# Patient Record
Sex: Female | Born: 1960 | Race: White | Hispanic: No | Marital: Married | Smoking: Never smoker
Health system: Southern US, Community
[De-identification: ages and names within clinical notes are randomized; demographics above are authoritative.]

## PROBLEM LIST (undated history)

## (undated) DIAGNOSIS — I773 Arterial fibromuscular dysplasia: Secondary | ICD-10-CM

## (undated) DIAGNOSIS — J189 Pneumonia, unspecified organism: Secondary | ICD-10-CM

## (undated) DIAGNOSIS — E785 Hyperlipidemia, unspecified: Secondary | ICD-10-CM

## (undated) DIAGNOSIS — M1990 Unspecified osteoarthritis, unspecified site: Secondary | ICD-10-CM

## (undated) DIAGNOSIS — H5703 Miosis: Secondary | ICD-10-CM

## (undated) HISTORY — PX: BACK SURGERY: SHX140

## (undated) HISTORY — DX: Hyperlipidemia, unspecified: E78.5

## (undated) HISTORY — PX: COLONOSCOPY: SHX174

---

## 1998-03-03 ENCOUNTER — Other Ambulatory Visit: Admission: RE | Admit: 1998-03-03 | Discharge: 1998-03-03 | Payer: Self-pay | Admitting: Obstetrics and Gynecology

## 1999-08-16 ENCOUNTER — Other Ambulatory Visit: Admission: RE | Admit: 1999-08-16 | Discharge: 1999-08-16 | Payer: Self-pay | Admitting: *Deleted

## 2000-10-18 ENCOUNTER — Other Ambulatory Visit: Admission: RE | Admit: 2000-10-18 | Discharge: 2000-10-18 | Payer: Self-pay | Admitting: *Deleted

## 2000-10-23 ENCOUNTER — Encounter: Admission: RE | Admit: 2000-10-23 | Discharge: 2000-10-23 | Payer: Self-pay | Admitting: Obstetrics and Gynecology

## 2000-10-23 ENCOUNTER — Encounter: Payer: Self-pay | Admitting: Obstetrics and Gynecology

## 2001-06-01 ENCOUNTER — Ambulatory Visit (HOSPITAL_BASED_OUTPATIENT_CLINIC_OR_DEPARTMENT_OTHER): Admission: RE | Admit: 2001-06-01 | Discharge: 2001-06-01 | Payer: Self-pay | Admitting: *Deleted

## 2002-08-29 ENCOUNTER — Encounter: Payer: Self-pay | Admitting: Family Medicine

## 2002-08-29 ENCOUNTER — Encounter: Admission: RE | Admit: 2002-08-29 | Discharge: 2002-08-29 | Payer: Self-pay | Admitting: Family Medicine

## 2003-01-20 ENCOUNTER — Encounter: Admission: RE | Admit: 2003-01-20 | Discharge: 2003-01-20 | Payer: Self-pay | Admitting: Family Medicine

## 2003-01-20 ENCOUNTER — Encounter: Payer: Self-pay | Admitting: Family Medicine

## 2003-02-20 ENCOUNTER — Encounter: Admission: RE | Admit: 2003-02-20 | Discharge: 2003-02-20 | Payer: Self-pay | Admitting: Orthopedic Surgery

## 2003-02-20 ENCOUNTER — Encounter: Payer: Self-pay | Admitting: Orthopedic Surgery

## 2003-03-04 ENCOUNTER — Encounter: Admission: RE | Admit: 2003-03-04 | Discharge: 2003-03-04 | Payer: Self-pay | Admitting: Orthopedic Surgery

## 2003-03-04 ENCOUNTER — Encounter: Payer: Self-pay | Admitting: Orthopedic Surgery

## 2003-06-18 ENCOUNTER — Ambulatory Visit (HOSPITAL_COMMUNITY): Admission: RE | Admit: 2003-06-18 | Discharge: 2003-06-19 | Payer: Self-pay | Admitting: Neurosurgery

## 2005-09-08 ENCOUNTER — Ambulatory Visit (HOSPITAL_COMMUNITY): Admission: RE | Admit: 2005-09-08 | Discharge: 2005-09-08 | Payer: Self-pay | Admitting: Orthopedic Surgery

## 2005-09-27 ENCOUNTER — Encounter: Admission: RE | Admit: 2005-09-27 | Discharge: 2005-09-27 | Payer: Self-pay | Admitting: Orthopedic Surgery

## 2006-02-16 ENCOUNTER — Ambulatory Visit (HOSPITAL_COMMUNITY): Admission: RE | Admit: 2006-02-16 | Discharge: 2006-02-16 | Payer: Self-pay | Admitting: Neurosurgery

## 2010-08-14 ENCOUNTER — Encounter: Payer: Self-pay | Admitting: Orthopedic Surgery

## 2010-11-05 ENCOUNTER — Ambulatory Visit (AMBULATORY_SURGERY_CENTER): Payer: BC Managed Care – PPO | Admitting: *Deleted

## 2010-11-05 VITALS — Ht 67.0 in | Wt 182.7 lb

## 2010-11-05 DIAGNOSIS — Z1211 Encounter for screening for malignant neoplasm of colon: Secondary | ICD-10-CM

## 2010-11-05 MED ORDER — PEG-KCL-NACL-NASULF-NA ASC-C 100 G PO SOLR: ORAL | Status: DC

## 2011-01-05 ENCOUNTER — Telehealth: Payer: Self-pay | Admitting: *Deleted

## 2011-01-05 NOTE — Telephone Encounter (Signed)
Patient called and cancelled colonoscopy for 01/07/11. Her husband recently found a lump on his neck and has been having CT scans, labs, etc and is actually now scheduled to have the lump excised on 01/07/11. She is unable to reschedule at this time until they get biopsy results back but she states she will reschedule as soon as she finds out the results of the biopsy. She did ask about charge for cancellation fee. I advised her that there would be no fee as she has a legitimate reason for cancellation (and she did cancel 2 days in advance). Patient verbalizes understanding.

## 2011-01-07 ENCOUNTER — Other Ambulatory Visit: Payer: Self-pay | Admitting: Internal Medicine

## 2011-02-23 ENCOUNTER — Ambulatory Visit (AMBULATORY_SURGERY_CENTER): Payer: BC Managed Care – PPO | Admitting: *Deleted

## 2011-02-23 VITALS — Ht 67.5 in | Wt 192.3 lb

## 2011-02-23 DIAGNOSIS — Z1211 Encounter for screening for malignant neoplasm of colon: Secondary | ICD-10-CM

## 2011-03-11 ENCOUNTER — Encounter: Payer: Self-pay | Admitting: Internal Medicine

## 2011-03-11 ENCOUNTER — Ambulatory Visit (AMBULATORY_SURGERY_CENTER): Payer: BC Managed Care – PPO | Admitting: Internal Medicine

## 2011-03-11 VITALS — BP 147/88 | HR 69 | Temp 97.5°F | Resp 16 | Ht 67.0 in | Wt 192.0 lb

## 2011-03-11 DIAGNOSIS — Z1211 Encounter for screening for malignant neoplasm of colon: Secondary | ICD-10-CM

## 2011-03-11 MED ORDER — SODIUM CHLORIDE 0.9 % IV SOLN: 500.0000 mL | INTRAVENOUS | Status: DC

## 2011-03-11 NOTE — Progress Notes (Signed)
PT TURNED TO BACK AND RIGHT SIDE IN ATTEMPT TO REACH CECUM.  PT GIVEN ADDITIONAL SEDATION TO KEEP HER COMFORTABLE. PRESSURE APPLIED TO ABDOMEN.

## 2011-03-11 NOTE — Patient Instructions (Addendum)
Please refer to your blue and neon green sheets for instructions regarding diet and activity for the rest of today.  You may resume your medications as you would normally take them.  Please read information about diverticulosis and high fiber diets

## 2011-03-11 NOTE — Progress Notes (Signed)
UNABLE TO REACH CECUM WITH 1ST SCOPE.  SWITCHED OUT TO DIFFERENT SCOPE 15 MINUTES INTO PROCEDURE .

## 2011-03-14 ENCOUNTER — Telehealth: Payer: Self-pay | Admitting: *Deleted

## 2011-03-14 NOTE — Telephone Encounter (Signed)
No answer, no identifier on answering machine, no message left for patient.

## 2011-11-15 ENCOUNTER — Other Ambulatory Visit: Payer: Self-pay | Admitting: Internal Medicine

## 2011-11-15 DIAGNOSIS — H02409 Unspecified ptosis of unspecified eyelid: Secondary | ICD-10-CM

## 2011-11-18 ENCOUNTER — Ambulatory Visit
Admission: RE | Admit: 2011-11-18 | Discharge: 2011-11-18 | Disposition: A | Discharge status: Home / Self Care | Payer: BC Managed Care – PPO | Source: Ambulatory Visit | Attending: Internal Medicine | Admitting: Internal Medicine

## 2011-11-18 ENCOUNTER — Other Ambulatory Visit: Payer: Self-pay | Admitting: Internal Medicine

## 2011-11-18 DIAGNOSIS — H02409 Unspecified ptosis of unspecified eyelid: Secondary | ICD-10-CM

## 2011-11-18 DIAGNOSIS — H539 Unspecified visual disturbance: Secondary | ICD-10-CM

## 2011-11-18 MED ORDER — GADOBENATE DIMEGLUMINE 529 MG/ML IV SOLN
17.0000 mL | Freq: Once | INTRAVENOUS | Status: AC | PRN
  Administered 2011-11-18: 17 mL via INTRAVENOUS

## 2011-12-08 ENCOUNTER — Other Ambulatory Visit: Payer: Self-pay | Admitting: Diagnostic Neuroimaging

## 2011-12-08 DIAGNOSIS — R9409 Abnormal results of other function studies of central nervous system: Secondary | ICD-10-CM

## 2011-12-08 DIAGNOSIS — I7771 Dissection of carotid artery: Secondary | ICD-10-CM

## 2011-12-08 DIAGNOSIS — G909 Disorder of the autonomic nervous system, unspecified: Secondary | ICD-10-CM

## 2011-12-13 ENCOUNTER — Ambulatory Visit
Admission: RE | Admit: 2011-12-13 | Discharge: 2011-12-13 | Disposition: A | Discharge status: Home / Self Care | Payer: BC Managed Care – PPO | Source: Ambulatory Visit | Attending: Diagnostic Neuroimaging | Admitting: Diagnostic Neuroimaging

## 2011-12-13 DIAGNOSIS — I7771 Dissection of carotid artery: Secondary | ICD-10-CM

## 2011-12-13 DIAGNOSIS — G909 Disorder of the autonomic nervous system, unspecified: Secondary | ICD-10-CM

## 2011-12-13 DIAGNOSIS — R9409 Abnormal results of other function studies of central nervous system: Secondary | ICD-10-CM

## 2011-12-13 MED ORDER — IOHEXOL 350 MG/ML SOLN
100.0000 mL | Freq: Once | INTRAVENOUS | Status: AC | PRN
  Administered 2011-12-13: 100 mL via INTRAVENOUS

## 2012-06-11 ENCOUNTER — Other Ambulatory Visit: Payer: Self-pay | Admitting: Diagnostic Neuroimaging

## 2012-06-11 DIAGNOSIS — G909 Disorder of the autonomic nervous system, unspecified: Secondary | ICD-10-CM

## 2012-06-11 DIAGNOSIS — I7771 Dissection of carotid artery: Secondary | ICD-10-CM

## 2012-06-20 ENCOUNTER — Ambulatory Visit
Admission: RE | Admit: 2012-06-20 | Discharge: 2012-06-20 | Disposition: A | Discharge status: Home / Self Care | Payer: BC Managed Care – PPO | Source: Ambulatory Visit | Attending: Diagnostic Neuroimaging | Admitting: Diagnostic Neuroimaging

## 2012-06-20 ENCOUNTER — Inpatient Hospital Stay: Admission: RE | Admit: 2012-06-20 | Payer: BC Managed Care – PPO | Source: Ambulatory Visit

## 2012-06-20 DIAGNOSIS — G909 Disorder of the autonomic nervous system, unspecified: Secondary | ICD-10-CM

## 2012-06-20 DIAGNOSIS — I7771 Dissection of carotid artery: Secondary | ICD-10-CM

## 2012-06-20 MED ORDER — IOHEXOL 350 MG/ML SOLN
100.0000 mL | Freq: Once | INTRAVENOUS | Status: AC | PRN
  Administered 2012-06-20: 100 mL via INTRAVENOUS

## 2013-05-01 IMAGING — CT CT ANGIO HEAD
1 of 10 series · 2 of 33 positions shown · IV contrast ([ID] OMNI 350)
Comparison: CTA 12/13/2011.  MRA/MRI 11/18/2011.

CTA NECK

CLINICAL DATA: 51-year-old female with right side Licina
syndrome.  Bilateral carotid disease.  Fibromuscular dysplasia.
Blurred vision and neck pain.

CT ANGIOGRAPHY HEAD AND NECK
TECHNIQUE: Multidetector CT imaging of the head and neck was
performed using the standard protocol during bolus administration
of intravenous contrast.  Multiplanar CT image reconstructions
including MIPs were obtained to evaluate the vascular anatomy.
Carotid stenosis measurements (when applicable) are obtained
utilizing NASCET criteria, using the distal internal carotid
diameter as the denominator.
Contrast: 100mL OMNIPAQUE IOHEXOL 350 MG/ML SOLN

[Series 7: cta head & neck · axial · 0.37mm/px · z∈[-217,+121]mm · 2 of 136 slices shown]
[im 1/136  soft-tissue]
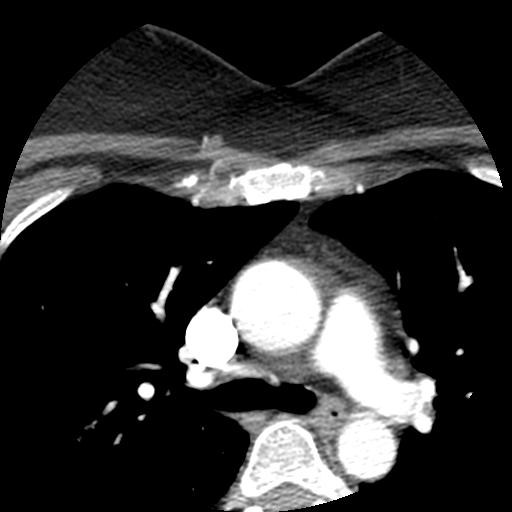
[im 136/136  bone]
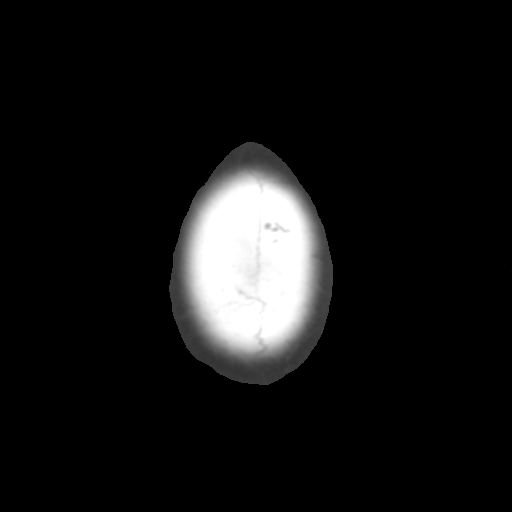

[2 of 33 positions shown; findings below may reference images not displayed]

FINDINGS: Negative lung apices.  No superior mediastinal
lymphadenopathy.  Stable and negative thyroid, larynx, pharynx,
parapharyngeal spaces, retropharyngeal space (retropharyngeal
course of the carotid arteries), sublingual space, submandibular
glands, parotid glands, and orbit soft tissues. Visualized
paranasal sinuses and mastoids are clear.  No acute osseous
abnormality identified.

Vascular Findings: Three-vessel arch configuration with no arch
atherosclerosis.  Normal great vessel origins.  Stable and negative
cervical vertebral arteries, fairly codominant.  Stable and
negative bilateral common carotid arteries aside from mild
tortuosity.

The right carotid bifurcation occurs in the retropharyngeal space.
No atherosclerosis.  Normal proximal cervical right ICA.

Stable and normal left carotid bifurcation with no atherosclerosis.
Normal proximal cervical left ICA except for tortuosity.

Interval regression of the laterally projecting pseudoaneurysm of
the distal cervical right ICA near the mastoid tip (coronal image
101). Small residual, left saccular.  No stenosis of the vessel.
Less irregularity of the adjacent segment of the right ICA.

Mild regression of irregularity and fusiform enlargement of the
distal cervical left ICA also just below the skull base (sagittal
image 118).  No associated stenosis of the vessel..

 Review of the MIP images confirms the above findings.
IMPRESSION: 1. Right better than left regression of distal cervical ICA
pseudoaneurysms. Left vessel irregularity of the adjacent ICA
segments.  No associated ICA stenosis.
2.  Stable and otherwise negative.
3.  Intracranial findings are below.

CTA HEAD
FINDINGS: Unchanged non-enhancing globular soft tissue focus
anterior to the distal basilar artery in the suprasellar cistern
(series 32 image 8, series 39 image 7.  No other intracranial mass
lesion.  No intracranial mass effect.  No ventriculomegaly.  Stable
and normal gray-white matter differentiation. No acute intracranial
hemorrhage identified.  Calvarium intact. No abnormal enhancement
identified.

Vascular Findings: Major intracranial venous structures are
enhancing.

Stable normal distal cervical ICA.  Patent PICA vessels.  Mildly
tortuous but otherwise negative vertebrobasilar junction.  No
basilar artery stenosis.  No mass effect on the basilar artery
remain dilated to the unusual soft tissue in the suprasellar
cistern.  SCA and PCA origins are normal.  Small posterior
communicating arteries are patent.  Bilateral PCA branches are
within normal limits.

Both ICA siphons remain patent and appear normal beyond the level
of the foraminal with barium.  Normal carotid termini.  Normal MCA
and ACA origins.  Normal anterior communicating artery.  ACA
branches are stable and within normal limits.  MCA branches are
stable and within normal limits.

 Review of the MIP images confirms the above findings.
IMPRESSION: 1.  Stable and negative intracranial CTA.
2.  Unchanged non-enhancing soft tissue lesion in the suprasellar
cistern anterior to the basilar artery.  No mass effect on the
basilar.  See MRI report 11/18/2011.

## 2013-07-10 ENCOUNTER — Encounter: Payer: Self-pay | Admitting: Internal Medicine

## 2013-07-10 ENCOUNTER — Ambulatory Visit (INDEPENDENT_AMBULATORY_CARE_PROVIDER_SITE_OTHER): Payer: BC Managed Care – PPO | Admitting: Internal Medicine

## 2013-07-10 VITALS — BP 138/91 | HR 77 | Temp 98.0°F | Ht 67.0 in | Wt 196.0 lb

## 2013-07-10 DIAGNOSIS — Z22322 Carrier or suspected carrier of Methicillin resistant Staphylococcus aureus: Secondary | ICD-10-CM

## 2013-07-10 MED ORDER — CHLORHEXIDINE GLUCONATE 4 % EX LIQD: Freq: Every day | CUTANEOUS | Status: DC | PRN

## 2013-07-10 MED ORDER — MUPIROCIN 2 % EX OINT: 1.0000 "application " | TOPICAL_OINTMENT | Freq: Two times a day (BID) | CUTANEOUS | Status: DC

## 2013-07-11 ENCOUNTER — Encounter: Payer: Self-pay | Admitting: Internal Medicine

## 2013-07-11 DIAGNOSIS — Z22322 Carrier or suspected carrier of Methicillin resistant Staphylococcus aureus: Secondary | ICD-10-CM | POA: Insufficient documentation

## 2013-07-11 NOTE — Progress Notes (Signed)
   Subjective:    Patient ID: Sierra Boone, female    DOB: 1961/05/19, 52 y.o.   MRN: [id number]  HPI Comes in for evaluation as a new patient.  Has had vulvar abscess x 4 over the last several months.  No current infection.  Grew MRSA positive in one culture.  Had I and D once, otherwise it opens on its own.  No fever or chills associated with it.    Review of Systems  Constitutional: Negative for fever and chills.  Gastrointestinal: Negative for nausea and diarrhea.  Genitourinary:       No current vulvar swelling  Neurological: Negative for dizziness.  Psychiatric/Behavioral: Negative for dysphoric mood.       Objective:   Physical Exam  Constitutional: She is oriented to person, place, and time. She appears well-developed and well-nourished. No distress.  Eyes: No scleral icterus.  Neurological: She is alert and oriented to person, place, and time.  Skin: No rash noted.  Psychiatric: She has a normal mood and affect. Her behavior is normal.          Assessment & Plan:

## 2013-07-11 NOTE — Assessment & Plan Note (Signed)
Will try colonization protocol. Instructions given.  Return PRN.

## 2020-05-04 ENCOUNTER — Other Ambulatory Visit: Payer: Self-pay | Admitting: Internal Medicine

## 2020-05-04 DIAGNOSIS — G5602 Carpal tunnel syndrome, left upper limb: Secondary | ICD-10-CM

## 2020-05-04 DIAGNOSIS — M5416 Radiculopathy, lumbar region: Secondary | ICD-10-CM

## 2020-05-21 ENCOUNTER — Other Ambulatory Visit: Payer: Self-pay

## 2020-05-21 ENCOUNTER — Ambulatory Visit
Admission: RE | Admit: 2020-05-21 | Discharge: 2020-05-21 | Disposition: A | Discharge status: Home / Self Care | Payer: BC Managed Care – PPO | Source: Ambulatory Visit | Attending: Internal Medicine | Admitting: Internal Medicine

## 2020-05-21 DIAGNOSIS — M5416 Radiculopathy, lumbar region: Secondary | ICD-10-CM

## 2020-05-21 DIAGNOSIS — G5602 Carpal tunnel syndrome, left upper limb: Secondary | ICD-10-CM

## 2020-07-08 ENCOUNTER — Other Ambulatory Visit: Payer: Self-pay | Admitting: Neurosurgery

## 2020-07-10 ENCOUNTER — Other Ambulatory Visit (HOSPITAL_COMMUNITY)
Admission: RE | Admit: 2020-07-10 | Discharge: 2020-07-10 | Disposition: A | Discharge status: Home / Self Care | Payer: BC Managed Care – PPO | Source: Ambulatory Visit | Attending: Neurosurgery | Admitting: Neurosurgery

## 2020-07-10 ENCOUNTER — Encounter (HOSPITAL_COMMUNITY): Payer: Self-pay | Admitting: Neurosurgery

## 2020-07-10 DIAGNOSIS — Z01812 Encounter for preprocedural laboratory examination: Secondary | ICD-10-CM | POA: Insufficient documentation

## 2020-07-10 DIAGNOSIS — Z20822 Contact with and (suspected) exposure to covid-19: Secondary | ICD-10-CM | POA: Diagnosis not present

## 2020-07-10 LAB — SARS CORONAVIRUS 2 (TAT 6-24 HRS): SARS Coronavirus 2: NEGATIVE

## 2020-07-10 NOTE — Anesthesia Preprocedure Evaluation (Addendum)
Anesthesia Evaluation  Patient identified by MRN, date of birth, ID band Patient awake    Reviewed: Allergy & Precautions, NPO status , Patient's Chart, lab work & pertinent test results  Airway Mallampati: III  TM Distance: >3 FB Neck ROM: Full    Dental  (+) Teeth Intact   Pulmonary neg pulmonary ROS,    Pulmonary exam normal breath sounds clear to auscultation       Cardiovascular Normal cardiovascular exam Rhythm:Regular Rate:Normal  fibromuscular dysplasia of carotid arteries (imaging in 2013)   Neuro/Psych Lumbar stenosis acquired Horner's syndrome negative psych ROS   GI/Hepatic negative GI ROS, Neg liver ROS,   Endo/Other  Obesity   Renal/GU negative Renal ROS     Musculoskeletal  (+) Arthritis ,   Abdominal   Peds  Hematology negative hematology ROS (+)   Anesthesia Other Findings   Reproductive/Obstetrics                           Anesthesia Physical Anesthesia Plan  ASA: II  Anesthesia Plan: General   Post-op Pain Management:    Induction: Intravenous  PONV Risk Score and Plan: 3 and Midazolam, Dexamethasone and Ondansetron  Airway Management Planned: Oral ETT and Video Laryngoscope Planned  Additional Equipment:   Intra-op Plan:   Post-operative Plan: Extubation in OR  Informed Consent: I have reviewed the patients History and Physical, chart, labs and discussed the procedure including the risks, benefits and alternatives for the proposed anesthesia with the patient or authorized representative who has indicated his/her understanding and acceptance.       Plan Discussed with: CRNA  Anesthesia Plan Comments: (PAT note written 07/10/2020 by Myra Gianotti, PA-C. History of fibromuscular dysplasia of carotid arteries (imaging in 2013), acquired Horner's syndrome.  She reported caution with neck positioning due fibromuscular dysplasia of her carotid arteries.  (Fibromuscular Dysplasia by Danella Sensing; Dulce Sellar found at [email or web address] states, "Patients should be informed about the risk of dissection and cautioned to avoid neck trauma and rigorous neck manipulations.") )     Anesthesia Quick Evaluation

## 2020-07-10 NOTE — Progress Notes (Addendum)
Anesthesia Chart Review:  Case: [id number] Date/Time: 07/13/20 0745   Procedure: PLIF - L4-L5 (N/A Back)   Anesthesia type: General   Pre-op diagnosis: Stenosis   Location: [facility] OR ROOM 21 / [place] [facility]   Surgeons: Earnie Larsson, MD      DISCUSSION: Patient is a 59 year old female scheduled for the above procedure.  History includes never smoker, fibromuscular dysplasia of carotid arteries (imaging in 2013), acquired Horner's syndrome, back surgery.  She reported caution with neck positioning due to fibromuscular dysplasia of her carotid arteries. [Fibromuscular Dysplasia by Danella Sensing; Dulce Sellar found at [email or web address] states, "Patients should be informed about the risk of dissection and cautioned to avoid neck trauma and rigorous neck manipulations."]  2nd Moderna COVID-19 vaccine documented in [facility] as 11/22/19. 07/10/2020 presurgical COVID-19 test in process.  She is a same day work-up, so further evaluation by anesthesia team on the day of surgery. There are some limited records from [facility] that can be viewed in [place] including comd labs from 05/20/20 showing a normal CBC and CMET and A1c 5.5%.  VS: For day of surgery   PROVIDERS: Merrilee Seashore, MD is PCP [facility])   LABS: For day of surgery.   IMAGES: MRI C-spine 06/16/20 (report in [facility]/PACS): IMPRESSION: - Mild spinal canal and bilateral neural foraminal narrowing at the C5-6 level. - Mild right C4-5 and moderate left C6-7 neural foraminal narrowing.  MRI L-spine 05/21/20: IMPRESSION: 1. Progression of moderate right, mild left L4-5 neural foraminal stenosis. 2. Otherwise mild degenerative disc disease without spinal canal or neural foraminal stenosis.  CTA head/neck 06/20/12: NECK  IMPRESSION: 1. 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 Left vessel irregularity of the adjacent  ICA segments. No associated ICA stenosis. 2. Stable and otherwise negative. 3. Intracranial findings are below. HEAD IMPRESSION: 1. Stable and negative intracranial CTA. 2. Unchanged non-enhancing soft tissue lesion in the suprasellar cistern anterior to the basilar artery. No mass effect on the basilar. See MRI report 11/18/2011.   CTA head/neck 12/13/11 NECK IMPRESSION: Probable fibromuscular dysplasia of both cervical internal carotid arteries. On the left, there is vessel irregularity with fusiform dilatation proximal to the skull base, maximal diameter 8 mm. On the right, the vessel is abnormal in the last 3.5 cm proximal to the skull base. In the proximal portion, the vessel is dilated to a diameter of 8.5 mm but the patent lumen measures only 3.2 mm. This could be due to chronic dissection or peripheral thrombus. Immediately beneath the skull base, there is a wide-mouth pseudoaneurysm projecting laterally extending overlying the 1 cm with diameter of 5 mm. HEAD IMPRESSION: Normal intracranial CT angiography.  MRA Neck 11/18/11: IMPRESSION: - No carotid bifurcation disease. - Probable fibromuscular dysplasia of both cervical internal carotid arteries. On the right, there is a laterally projecting pseudoaneurysm measuring 5 x 8 mm. On the left, there is fusiform dilatation with diameter of 7 mm.   EKG: N/A   CV: N/A  Past Medical History:  Diagnosis Date   Arthritis    back   Fibromuscular dysplasia of both carotid arteries (HCC)    Pneumonia    as a child   Pupil constriction    left eye due to a severe coughing fit    Past Surgical History:  Procedure Laterality Date   BACK SURGERY     lower back (L5) x2   COLONOSCOPY  24years ago   in [place](Normal exam)    MEDICATIONS:  No current facility-administered medications for this encounter.   No current outpatient medications on file.    Myra Gianotti, PA-C Surgical Short  Stay/Anesthesiology [facility] Phone [phone] [facility] Phone [phone] 07/10/2020 2:41 PM

## 2020-07-10 NOTE — Progress Notes (Signed)
Spoke with pt for pre-op call. Pt denies cardiac history, HTN or Diabetes. Pt does have Fibromuscular dysplasia of the carotid arteries and she cannot have her neck in certain positions because it could cause a stroke.   She states she had her Covid test done this AM.  Pt states she's been in quarantine since the test was done and understands that she stays in quarantine until she comes to the hospital

## 2020-07-13 ENCOUNTER — Observation Stay (HOSPITAL_COMMUNITY)
Admission: RE | Admit: 2020-07-13 | Discharge: 2020-07-14 | Disposition: A | Discharge status: Home / Self Care | Payer: BC Managed Care – PPO | Attending: Neurosurgery | Admitting: Neurosurgery

## 2020-07-13 ENCOUNTER — Ambulatory Visit (HOSPITAL_COMMUNITY): Payer: BC Managed Care – PPO | Admitting: Vascular Surgery

## 2020-07-13 ENCOUNTER — Encounter (HOSPITAL_COMMUNITY): Payer: Self-pay | Admitting: Neurosurgery

## 2020-07-13 ENCOUNTER — Ambulatory Visit (HOSPITAL_COMMUNITY): Payer: BC Managed Care – PPO

## 2020-07-13 ENCOUNTER — Other Ambulatory Visit: Payer: Self-pay

## 2020-07-13 ENCOUNTER — Encounter (HOSPITAL_COMMUNITY)
Admission: RE | Disposition: A | Discharge status: Home / Self Care | Payer: Self-pay | Source: Home / Self Care | Attending: Neurosurgery

## 2020-07-13 DIAGNOSIS — M48061 Spinal stenosis, lumbar region without neurogenic claudication: Principal | ICD-10-CM | POA: Insufficient documentation

## 2020-07-13 DIAGNOSIS — M5116 Intervertebral disc disorders with radiculopathy, lumbar region: Secondary | ICD-10-CM | POA: Diagnosis not present

## 2020-07-13 DIAGNOSIS — Z419 Encounter for procedure for purposes other than remedying health state, unspecified: Secondary | ICD-10-CM

## 2020-07-13 HISTORY — DX: Miosis: H57.03

## 2020-07-13 HISTORY — DX: Unspecified osteoarthritis, unspecified site: M19.90

## 2020-07-13 HISTORY — DX: Arterial fibromuscular dysplasia: I77.3

## 2020-07-13 HISTORY — DX: Pneumonia, unspecified organism: J18.9

## 2020-07-13 LAB — CBC WITH DIFFERENTIAL/PLATELET
Abs Immature Granulocytes: 0.03 10*3/uL (ref 0.00–0.07)
Basophils Absolute: 0 10*3/uL (ref 0.0–0.1)
Basophils Relative: 1 %
Eosinophils Absolute: 0.1 10*3/uL (ref 0.0–0.5)
Eosinophils Relative: 1 %
HCT: 43 % (ref 36.0–46.0)
Hemoglobin: 13.7 g/dL (ref 12.0–15.0)
Immature Granulocytes: 0 %
Lymphocytes Relative: 29 %
Lymphs Abs: 1.9 10*3/uL (ref 0.7–4.0)
MCH: 28.7 pg (ref 26.0–34.0)
MCHC: 31.9 g/dL (ref 30.0–36.0)
MCV: 90.1 fL (ref 80.0–100.0)
Monocytes Absolute: 0.6 10*3/uL (ref 0.1–1.0)
Monocytes Relative: 9 %
Neutro Abs: 4 10*3/uL (ref 1.7–7.7)
Neutrophils Relative %: 60 %
Platelets: 251 10*3/uL (ref 150–400)
RBC: 4.77 MIL/uL (ref 3.87–5.11)
RDW: 12.4 % (ref 11.5–15.5)
WBC: 6.7 10*3/uL (ref 4.0–10.5)
nRBC: 0 % (ref 0.0–0.2)

## 2020-07-13 LAB — SURGICAL PCR SCREEN
MRSA, PCR: NEGATIVE
Staphylococcus aureus: NEGATIVE

## 2020-07-13 LAB — TYPE AND SCREEN
ABO/RH(D): O POS
Antibody Screen: NEGATIVE

## 2020-07-13 SURGERY — POSTERIOR LUMBAR FUSION 1 LEVEL
Anesthesia: General | Site: Back

## 2020-07-13 MED ORDER — ROCURONIUM BROMIDE 10 MG/ML (PF) SYRINGE
PREFILLED_SYRINGE | INTRAVENOUS | Status: DC | PRN
  Administered 2020-07-13: 60 mg via INTRAVENOUS
  Administered 2020-07-13: 10 mg via INTRAVENOUS

## 2020-07-13 MED ORDER — CEFAZOLIN SODIUM-DEXTROSE 1-4 GM/50ML-% IV SOLN
1.0000 g | Freq: Three times a day (TID) | INTRAVENOUS | Status: AC
  Administered 2020-07-13 (×2): 1 g via INTRAVENOUS
  Filled 2020-07-13 (×2): qty 50

## 2020-07-13 MED ORDER — CHLORHEXIDINE GLUCONATE CLOTH 2 % EX PADS: 6.0000 | MEDICATED_PAD | Freq: Once | CUTANEOUS | Status: DC

## 2020-07-13 MED ORDER — ACETAMINOPHEN 650 MG RE SUPP: 650.0000 mg | RECTAL | Status: DC | PRN

## 2020-07-13 MED ORDER — DEXAMETHASONE SODIUM PHOSPHATE 10 MG/ML IJ SOLN
10.0000 mg | Freq: Once | INTRAMUSCULAR | Status: DC
  Filled 2020-07-13: qty 1

## 2020-07-13 MED ORDER — LACTATED RINGERS IV SOLN: INTRAVENOUS | Status: DC

## 2020-07-13 MED ORDER — VANCOMYCIN HCL 1000 MG IV SOLR
INTRAVENOUS | Status: DC | PRN
  Administered 2020-07-13: 1000 mg via TOPICAL

## 2020-07-13 MED ORDER — EPHEDRINE 5 MG/ML INJ
INTRAVENOUS | Status: AC
  Filled 2020-07-13: qty 10

## 2020-07-13 MED ORDER — SODIUM CHLORIDE 0.9 % IV SOLN: 250.0000 mL | INTRAVENOUS | Status: DC

## 2020-07-13 MED ORDER — THROMBIN 20000 UNITS EX SOLR
CUTANEOUS | Status: AC
  Filled 2020-07-13: qty 20000

## 2020-07-13 MED ORDER — BISACODYL 10 MG RE SUPP: 10.0000 mg | Freq: Every day | RECTAL | Status: DC | PRN

## 2020-07-13 MED ORDER — THROMBIN 20000 UNITS EX SOLR
CUTANEOUS | Status: DC | PRN
  Administered 2020-07-13: 20 mL via TOPICAL

## 2020-07-13 MED ORDER — LIDOCAINE 2% (20 MG/ML) 5 ML SYRINGE
INTRAMUSCULAR | Status: DC | PRN
  Administered 2020-07-13: 80 mg via INTRAVENOUS

## 2020-07-13 MED ORDER — DIAZEPAM 5 MG PO TABS
5.0000 mg | ORAL_TABLET | Freq: Four times a day (QID) | ORAL | Status: DC | PRN
  Administered 2020-07-13 – 2020-07-14 (×3): 5 mg via ORAL
  Filled 2020-07-13 (×3): qty 1

## 2020-07-13 MED ORDER — HYDROXYZINE HCL 50 MG/ML IM SOLN
50.0000 mg | Freq: Four times a day (QID) | INTRAMUSCULAR | Status: DC | PRN
  Administered 2020-07-13: 50 mg via INTRAMUSCULAR
  Filled 2020-07-13: qty 1

## 2020-07-13 MED ORDER — ONDANSETRON HCL 4 MG PO TABS: 4.0000 mg | ORAL_TABLET | Freq: Four times a day (QID) | ORAL | Status: DC | PRN

## 2020-07-13 MED ORDER — PHENYLEPHRINE 40 MCG/ML (10ML) SYRINGE FOR IV PUSH (FOR BLOOD PRESSURE SUPPORT)
PREFILLED_SYRINGE | INTRAVENOUS | Status: AC
  Filled 2020-07-13: qty 10

## 2020-07-13 MED ORDER — DEXAMETHASONE SODIUM PHOSPHATE 10 MG/ML IJ SOLN
INTRAMUSCULAR | Status: DC | PRN
  Administered 2020-07-13: 10 mg via INTRAVENOUS

## 2020-07-13 MED ORDER — PHENOL 1.4 % MT LIQD: 1.0000 | OROMUCOSAL | Status: DC | PRN

## 2020-07-13 MED ORDER — SUGAMMADEX SODIUM 200 MG/2ML IV SOLN
INTRAVENOUS | Status: DC | PRN
  Administered 2020-07-13: 200 mg via INTRAVENOUS

## 2020-07-13 MED ORDER — EPHEDRINE SULFATE-NACL 50-0.9 MG/10ML-% IV SOSY
PREFILLED_SYRINGE | INTRAVENOUS | Status: DC | PRN
  Administered 2020-07-13: 5 mg via INTRAVENOUS
  Administered 2020-07-13: 10 mg via INTRAVENOUS

## 2020-07-13 MED ORDER — ONDANSETRON HCL 4 MG/2ML IJ SOLN
INTRAMUSCULAR | Status: AC
  Filled 2020-07-13: qty 2

## 2020-07-13 MED ORDER — FENTANYL CITRATE (PF) 250 MCG/5ML IJ SOLN
INTRAMUSCULAR | Status: DC | PRN
  Administered 2020-07-13 (×2): 50 ug via INTRAVENOUS
  Administered 2020-07-13: 100 ug via INTRAVENOUS
  Administered 2020-07-13: 50 ug via INTRAVENOUS

## 2020-07-13 MED ORDER — FENTANYL CITRATE (PF) 100 MCG/2ML IJ SOLN
INTRAMUSCULAR | Status: AC
  Filled 2020-07-13: qty 2

## 2020-07-13 MED ORDER — MENTHOL 3 MG MT LOZG: 1.0000 | LOZENGE | OROMUCOSAL | Status: DC | PRN

## 2020-07-13 MED ORDER — PROMETHAZINE HCL 25 MG/ML IJ SOLN: 6.2500 mg | INTRAMUSCULAR | Status: DC | PRN

## 2020-07-13 MED ORDER — ACETAMINOPHEN 325 MG PO TABS: 650.0000 mg | ORAL_TABLET | ORAL | Status: DC | PRN

## 2020-07-13 MED ORDER — PROPOFOL 10 MG/ML IV BOLUS
INTRAVENOUS | Status: AC
  Filled 2020-07-13: qty 20

## 2020-07-13 MED ORDER — CHLORHEXIDINE GLUCONATE 0.12 % MT SOLN
OROMUCOSAL | Status: AC
  Administered 2020-07-13: 15 mL
  Filled 2020-07-13: qty 15

## 2020-07-13 MED ORDER — PROPOFOL 10 MG/ML IV BOLUS
INTRAVENOUS | Status: DC | PRN
  Administered 2020-07-13: 140 mg via INTRAVENOUS

## 2020-07-13 MED ORDER — FENTANYL CITRATE (PF) 100 MCG/2ML IJ SOLN
25.0000 ug | INTRAMUSCULAR | Status: DC | PRN
  Administered 2020-07-13: 25 ug via INTRAVENOUS
  Administered 2020-07-13: 50 ug via INTRAVENOUS

## 2020-07-13 MED ORDER — ONDANSETRON HCL 4 MG/2ML IJ SOLN: 4.0000 mg | Freq: Four times a day (QID) | INTRAMUSCULAR | Status: DC | PRN

## 2020-07-13 MED ORDER — SODIUM CHLORIDE 0.9% FLUSH: 3.0000 mL | INTRAVENOUS | Status: DC | PRN

## 2020-07-13 MED ORDER — FENTANYL CITRATE (PF) 250 MCG/5ML IJ SOLN
INTRAMUSCULAR | Status: AC
  Filled 2020-07-13: qty 5

## 2020-07-13 MED ORDER — MIDAZOLAM HCL 2 MG/2ML IJ SOLN
INTRAMUSCULAR | Status: AC
  Filled 2020-07-13: qty 2

## 2020-07-13 MED ORDER — ROCURONIUM BROMIDE 10 MG/ML (PF) SYRINGE
PREFILLED_SYRINGE | INTRAVENOUS | Status: AC
  Filled 2020-07-13: qty 10

## 2020-07-13 MED ORDER — PHENYLEPHRINE HCL-NACL 10-0.9 MG/250ML-% IV SOLN
INTRAVENOUS | Status: DC | PRN
  Administered 2020-07-13: 15 ug/min via INTRAVENOUS

## 2020-07-13 MED ORDER — OXYCODONE HCL 5 MG PO TABS
10.0000 mg | ORAL_TABLET | ORAL | Status: DC | PRN
  Administered 2020-07-13 – 2020-07-14 (×4): 10 mg via ORAL
  Filled 2020-07-13 (×4): qty 2

## 2020-07-13 MED ORDER — BUPIVACAINE HCL (PF) 0.25 % IJ SOLN
INTRAMUSCULAR | Status: AC
  Filled 2020-07-13: qty 30

## 2020-07-13 MED ORDER — POLYETHYLENE GLYCOL 3350 17 G PO PACK: 17.0000 g | PACK | Freq: Every day | ORAL | Status: DC | PRN

## 2020-07-13 MED ORDER — MIDAZOLAM HCL 5 MG/5ML IJ SOLN
INTRAMUSCULAR | Status: DC | PRN
  Administered 2020-07-13: 2 mg via INTRAVENOUS

## 2020-07-13 MED ORDER — 0.9 % SODIUM CHLORIDE (POUR BTL) OPTIME
TOPICAL | Status: DC | PRN
  Administered 2020-07-13: 1000 mL

## 2020-07-13 MED ORDER — FLEET ENEMA 7-19 GM/118ML RE ENEM: 1.0000 | ENEMA | Freq: Once | RECTAL | Status: DC | PRN

## 2020-07-13 MED ORDER — LIDOCAINE 2% (20 MG/ML) 5 ML SYRINGE
INTRAMUSCULAR | Status: AC
  Filled 2020-07-13: qty 5

## 2020-07-13 MED ORDER — BUPIVACAINE HCL (PF) 0.25 % IJ SOLN
INTRAMUSCULAR | Status: DC | PRN
  Administered 2020-07-13: 20 mL

## 2020-07-13 MED ORDER — PHENYLEPHRINE 40 MCG/ML (10ML) SYRINGE FOR IV PUSH (FOR BLOOD PRESSURE SUPPORT)
PREFILLED_SYRINGE | INTRAVENOUS | Status: DC | PRN
  Administered 2020-07-13 (×3): 80 ug via INTRAVENOUS

## 2020-07-13 MED ORDER — LACTATED RINGERS IV SOLN: INTRAVENOUS | Status: DC | PRN

## 2020-07-13 MED ORDER — VANCOMYCIN HCL 1000 MG IV SOLR
INTRAVENOUS | Status: AC
  Filled 2020-07-13: qty 1000

## 2020-07-13 MED ORDER — SODIUM CHLORIDE 0.9% FLUSH
3.0000 mL | Freq: Two times a day (BID) | INTRAVENOUS | Status: DC
  Administered 2020-07-13: 3 mL via INTRAVENOUS

## 2020-07-13 MED ORDER — HYDROMORPHONE HCL 1 MG/ML IJ SOLN
1.0000 mg | INTRAMUSCULAR | Status: DC | PRN
  Administered 2020-07-13: 1 mg via INTRAVENOUS
  Filled 2020-07-13: qty 1

## 2020-07-13 MED ORDER — ACETAMINOPHEN 500 MG PO TABS
1000.0000 mg | ORAL_TABLET | Freq: Once | ORAL | Status: AC
  Administered 2020-07-13: 1000 mg via ORAL
  Filled 2020-07-13: qty 2

## 2020-07-13 MED ORDER — ONDANSETRON HCL 4 MG/2ML IJ SOLN
INTRAMUSCULAR | Status: DC | PRN
  Administered 2020-07-13: 4 mg via INTRAVENOUS

## 2020-07-13 MED ORDER — HYDROCODONE-ACETAMINOPHEN 10-325 MG PO TABS
1.0000 | ORAL_TABLET | ORAL | Status: DC | PRN
Start: 2020-07-13 — End: 2020-07-14

## 2020-07-13 MED ORDER — CEFAZOLIN SODIUM-DEXTROSE 2-4 GM/100ML-% IV SOLN
2.0000 g | INTRAVENOUS | Status: AC
  Administered 2020-07-13: 2 g via INTRAVENOUS
  Filled 2020-07-13: qty 100

## 2020-07-13 SURGICAL SUPPLY — 64 items
APL SKNCLS STERI-STRIP NONHPOA (GAUZE/BANDAGES/DRESSINGS) ×1
BAG DECANTER FOR FLEXI CONT (MISCELLANEOUS) ×3 IMPLANT
BENZOIN TINCTURE PRP APPL 2/3 (GAUZE/BANDAGES/DRESSINGS) ×3 IMPLANT
BLADE CLIPPER SURG (BLADE) IMPLANT
BUR CUTTER 7.0 ROUND (BURR) ×3 IMPLANT
BUR MATCHSTICK NEURO 3.0 LAGG (BURR) ×3 IMPLANT
CAGE EXP CATALYFT 9 (Plate) ×6 IMPLANT
CANISTER SUCT 3000ML PPV (MISCELLANEOUS) ×3 IMPLANT
CAP LCK SPNE (Orthopedic Implant) ×4 IMPLANT
CAP LOCK SPINE RADIUS (Orthopedic Implant) ×4 IMPLANT
CAP LOCKING (Orthopedic Implant) ×12 IMPLANT
CARTRIDGE OIL MAESTRO DRILL (MISCELLANEOUS) ×1 IMPLANT
CLOSURE WOUND 1/2 X4 (GAUZE/BANDAGES/DRESSINGS) ×1
CNTNR URN SCR LID CUP LEK RST (MISCELLANEOUS) ×1 IMPLANT
CONT SPEC 4OZ STRL OR WHT (MISCELLANEOUS) ×3
COVER BACK TABLE 60X90IN (DRAPES) ×3 IMPLANT
COVER WAND RF STERILE (DRAPES) ×3 IMPLANT
DECANTER SPIKE VIAL GLASS SM (MISCELLANEOUS) ×3 IMPLANT
DERMABOND ADVANCED (GAUZE/BANDAGES/DRESSINGS) ×2
DERMABOND ADVANCED .7 DNX12 (GAUZE/BANDAGES/DRESSINGS) ×1 IMPLANT
DIFFUSER DRILL AIR PNEUMATIC (MISCELLANEOUS) ×3 IMPLANT
DRAPE C-ARM 42X72 X-RAY (DRAPES) ×6 IMPLANT
DRAPE HALF SHEET 40X57 (DRAPES) IMPLANT
DRAPE LAPAROTOMY 100X72X124 (DRAPES) ×3 IMPLANT
DRAPE SURG 17X23 STRL (DRAPES) ×12 IMPLANT
DRSG OPSITE POSTOP 4X6 (GAUZE/BANDAGES/DRESSINGS) ×3 IMPLANT
DURAPREP 26ML APPLICATOR (WOUND CARE) ×3 IMPLANT
ELECT REM PT RETURN 9FT ADLT (ELECTROSURGICAL) ×3
ELECTRODE REM PT RTRN 9FT ADLT (ELECTROSURGICAL) ×1 IMPLANT
EVACUATOR 1/8 PVC DRAIN (DRAIN) IMPLANT
GAUZE 4X4 16PLY RFD (DISPOSABLE) IMPLANT
GAUZE SPONGE 4X4 12PLY STRL (GAUZE/BANDAGES/DRESSINGS) IMPLANT
GLOVE BIO SURGEON STRL SZ 6.5 (GLOVE) ×2 IMPLANT
GLOVE BIO SURGEONS STRL SZ 6.5 (GLOVE) ×1
GLOVE BIOGEL PI IND STRL 6.5 (GLOVE) ×1 IMPLANT
GLOVE BIOGEL PI INDICATOR 6.5 (GLOVE) ×2
GLOVE ECLIPSE 9.0 STRL (GLOVE) ×6 IMPLANT
GLOVE EXAM NITRILE XL STR (GLOVE) IMPLANT
GOWN STRL REUS W/ TWL LRG LVL3 (GOWN DISPOSABLE) IMPLANT
GOWN STRL REUS W/ TWL XL LVL3 (GOWN DISPOSABLE) ×2 IMPLANT
GOWN STRL REUS W/TWL 2XL LVL3 (GOWN DISPOSABLE) IMPLANT
GOWN STRL REUS W/TWL LRG LVL3 (GOWN DISPOSABLE)
GOWN STRL REUS W/TWL XL LVL3 (GOWN DISPOSABLE) ×6
KIT BASIN OR (CUSTOM PROCEDURE TRAY) ×3 IMPLANT
KIT TURNOVER KIT B (KITS) ×3 IMPLANT
MILL MEDIUM DISP (BLADE) ×3 IMPLANT
NEEDLE HYPO 22GX1.5 SAFETY (NEEDLE) ×3 IMPLANT
NS IRRIG 1000ML POUR BTL (IV SOLUTION) ×3 IMPLANT
OIL CARTRIDGE MAESTRO DRILL (MISCELLANEOUS) ×3
PACK LAMINECTOMY NEURO (CUSTOM PROCEDURE TRAY) ×3 IMPLANT
PASTE BONE GRAFTON 1CC (Bone Implant) ×3 IMPLANT
ROD RADIUS 35MM (Rod) ×6 IMPLANT
SCREW 5.75X40M (Screw) ×6 IMPLANT
SCREW 5.75X45MM (Screw) ×6 IMPLANT
SPONGE SURGIFOAM ABS GEL 100 (HEMOSTASIS) ×3 IMPLANT
STRIP CLOSURE SKIN 1/2X4 (GAUZE/BANDAGES/DRESSINGS) ×2 IMPLANT
SUT VIC AB 0 CT1 18XCR BRD8 (SUTURE) ×2 IMPLANT
SUT VIC AB 0 CT1 8-18 (SUTURE) ×6
SUT VIC AB 2-0 CT1 18 (SUTURE) ×3 IMPLANT
SUT VIC AB 3-0 SH 8-18 (SUTURE) ×9 IMPLANT
TOWEL GREEN STERILE (TOWEL DISPOSABLE) ×3 IMPLANT
TOWEL GREEN STERILE FF (TOWEL DISPOSABLE) ×3 IMPLANT
TRAY FOLEY MTR SLVR 16FR STAT (SET/KITS/TRAYS/PACK) ×3 IMPLANT
WATER STERILE IRR 1000ML POUR (IV SOLUTION) ×3 IMPLANT

## 2020-07-13 NOTE — H&P (Signed)
Sierra Boone is an 59 y.o. female.   Chief Complaint: Back pain HPI: 59 year old female with back and left lower extremity pain failing conservative management.  Patient status post 2 prior L45 microdiscectomies remotely in the past.  Patient now with marked to space collapse with foraminal stenosis and some lateral recess stenosis at L4-5.  Patient presents now for lumbar decompression and fusion in hopes of improving her symptoms.  Past Medical History:  Diagnosis Date  . Arthritis    back  . Fibromuscular dysplasia of both carotid arteries ([place])   . Pneumonia    as a child  . Pupil constriction    left eye due to a severe coughing fit    Past Surgical History:  Procedure Laterality Date  . BACK SURGERY     lower back (L5) x2  . COLONOSCOPY  24years ago   in [place](Normal exam)    Family History  Problem Relation Age of Onset  . Glaucoma Brother   . Glaucoma Maternal Grandfather    Social History:  reports that she has never smoked. She has never used smokeless tobacco. She reports current alcohol use of about 4.0 standard drinks of alcohol per week. She reports that she does not use drugs.  Allergies: No Known Allergies  No medications prior to admission.    Results for orders placed or performed during the hospital encounter of 07/13/20 (from the past 48 hour(s))  CBC WITH DIFFERENTIAL     Status: None   Collection Time: 07/13/20  5:57 AM  Result Value Ref Range   WBC 6.7 4.0 - 10.5 K/uL   RBC 4.77 3.87 - 5.11 MIL/uL   Hemoglobin 13.7 12.0 - 15.0 g/dL   HCT 43.0 36.0 - 46.0 %   MCV 90.1 80.0 - 100.0 fL   MCH 28.7 26.0 - 34.0 pg   MCHC 31.9 30.0 - 36.0 g/dL   RDW 12.4 11.5 - 15.5 %   Platelets 251 150 - 400 K/uL   nRBC 0.0 0.0 - 0.2 %   Neutrophils Relative % 60 %   Neutro Abs 4.0 1.7 - 7.7 K/uL   Lymphocytes Relative 29 %   Lymphs Abs 1.9 0.7 - 4.0 K/uL   Monocytes Relative 9 %   Monocytes Absolute 0.6 0.1 - 1.0 K/uL   Eosinophils  Relative 1 %   Eosinophils Absolute 0.1 0.0 - 0.5 K/uL   Basophils Relative 1 %   Basophils Absolute 0.0 0.0 - 0.1 K/uL   Immature Granulocytes 0 %   Abs Immature Granulocytes 0.03 0.00 - 0.07 K/uL    Comment: Performed at [facility], [address], [place], [place] [address]  Type and screen     Status: None   Collection Time: 07/13/20  6:13 AM  Result Value Ref Range   ABO/RH(D) O POS    Antibody Screen NEG    Sample Expiration      07/16/2020,2359 Performed at [facility], [place] [address], [place], [place] [address]    No results found.  Pertinent items noted in HPI and remainder of comprehensive ROS otherwise negative.  Blood pressure 133/77, pulse (!) 55, temperature 98.1 F (36.7 C), temperature source Oral, resp. rate 18, height 5\' 7"  (1.702 m), weight 88.5 kg, last menstrual period 05/23/2012, SpO2 98 %.  Patient is awake and alert.  She is oriented and appropriate.  Speech is fluent.  Judgment insight intact.  Cranial nerve function normal bilateral.  Motor examination extremities reveal  some mild weakness of dorsiflexion her left foot otherwise motor strength intact extensor examination with decrease sensation pinprick and light touch in her left L5 dermatome.  Deep tender if is normal active.  No evidence of long track signs.  Gait somewhat antalgic.  Posture mildly flexed peer examination head ears eyes nose throat is unremarkable her chest and abdomen are benign.  Extremities are free of major deformity. Assessment/Plan L4-5 severe degenerative disc disease with foraminal stenosis and radiculopathy.  Plan bilateral L4-5 decompressive laminotomies and foraminotomies with posterior lumbar interbody fusion utilizing interbody cages, local autograft and pedicle screw fixation.  With posterior arthrodesis.  Risks and benefits been explained.  Patient wishes to proceed.  Mallie Mussel A Skylin Kennerson 07/13/2020, 8:01 AM

## 2020-07-13 NOTE — Anesthesia Postprocedure Evaluation (Signed)
Anesthesia Post Note  Patient: Sierra Boone  Procedure(s) Performed: Posterior Lumbar Interbody Fusion - Lumbar Four- Lumbar Five (N/A Back)     Patient location during evaluation: PACU Anesthesia Type: General Level of consciousness: awake and alert, oriented and awake Pain management: pain level controlled Vital Signs Assessment: post-procedure vital signs reviewed and stable Respiratory status: spontaneous breathing, nonlabored ventilation, respiratory function stable and patient connected to nasal cannula oxygen Cardiovascular status: blood pressure returned to baseline and stable Postop Assessment: no apparent nausea or vomiting Anesthetic complications: no   No complications documented.  Last Vitals:  Vitals:   07/13/20 1100 07/13/20 1103  BP:  129/84  Pulse: (!) 54 (!) 51  Resp: 11 12  Temp:    SpO2: 100% 100%    Last Pain:  Vitals:   07/13/20 1048  TempSrc:   PainSc: 8                  Catalina Gravel

## 2020-07-13 NOTE — Transfer of Care (Signed)
Immediate Anesthesia Transfer of Care Note  Patient: Sierra Boone  Procedure(s) Performed: Posterior Lumbar Interbody Fusion - Lumbar Four- Lumbar Five (N/A Back)  Patient Location: PACU  Anesthesia Type:General  Level of Consciousness: awake, alert  and oriented  Airway & Oxygen Therapy: Patient Spontanous Breathing  Post-op Assessment: Report given to RN and Post -op Vital signs reviewed and stable  Post vital signs: Reviewed and stable  Last Vitals:  Vitals Value Taken Time  BP    Temp    Pulse 75 07/13/20 1018  Resp 14 07/13/20 1018  SpO2 98 % 07/13/20 1018  Vitals shown include unvalidated device data.  Last Pain:  Vitals:   07/13/20 0618  TempSrc: Oral  PainSc:          Complications: No complications documented.

## 2020-07-13 NOTE — Anesthesia Procedure Notes (Signed)
Procedure Name: Intubation Date/Time: 07/13/2020 8:17 AM Performed by: Harden Mo, CRNA Pre-anesthesia Checklist: Patient identified, Emergency Drugs available, Suction available and Patient being monitored Patient Re-evaluated:Patient Re-evaluated prior to induction Oxygen Delivery Method: Circle System Utilized Preoxygenation: Pre-oxygenation with 100% oxygen Induction Type: IV induction Ventilation: Mask ventilation without difficulty Laryngoscope Size: Glidescope and 3 Grade View: Grade I Tube type: Oral Tube size: 7.5 mm Number of attempts: 1 Airway Equipment and Method: Stylet and Oral airway Placement Confirmation: ETT inserted through vocal cords under direct vision,  positive ETCO2 and breath sounds checked- equal and bilateral Secured at: 22 cm Tube secured with: Tape Dental Injury: Teeth and Oropharynx as per pre-operative assessment

## 2020-07-13 NOTE — Brief Op Note (Signed)
07/13/2020  10:13 AM  PATIENT:  Sierra Boone  59 y.o. female  PRE-OPERATIVE DIAGNOSIS:  Stenosis  POST-OPERATIVE DIAGNOSIS:  Stenosis  PROCEDURE:  Procedure(s) with comments: Posterior Lumbar Interbody Fusion - Lumbar Four- Lumbar Five (N/A) - Posterior Lumbar Interbody Fusion - Lumbar Four- Lumbar Five  SURGEON:  Surgeon(s) and Role:    Earnie Larsson, MD - Primary  PHYSICIAN ASSISTANT:   ASSISTANTSMearl Latin   ANESTHESIA:   general  EBL:  100 mL   BLOOD ADMINISTERED:none  DRAINS: none   LOCAL MEDICATIONS USED:  MARCAINE     SPECIMEN:  No Specimen  DISPOSITION OF SPECIMEN:  N/A  COUNTS:  YES  TOURNIQUET:  * No tourniquets in log *  DICTATION: .Dragon Dictation  PLAN OF CARE: Admit for overnight observation  PATIENT DISPOSITION:  PACU - hemodynamically stable.   Delay start of Pharmacological VTE agent (>24hrs) due to surgical blood loss or risk of bleeding: yes

## 2020-07-13 NOTE — Progress Notes (Signed)
Orthopedic Tech Progress Note Patient Details:  Sierra Boone 15-Oct-1960 [id number] Ordered patients back brace Patient ID: Sierra Boone, female   DOB: 15-Feb-1961, 59 y.o.   MRN: [id number]   Sierra Boone 07/13/2020, 12:34 PM

## 2020-07-13 NOTE — Op Note (Signed)
Date of procedure: 07/13/2020  Date of dictation: Same  Service: Neurosurgery  Preoperative diagnosis: L4-5 degenerative disc disease status post 2 prior lumbar discectomies with lateral recess and foraminal stenosis   Postoperative diagnosis: Same  Procedure Name: Bilateral L4-5 redo decompressive laminotomies and foraminotomies, more than would be required for simple interbody fusion alone.  L4-5 posterior lumbar interbody fusion utilizing interbody cages, morselized autograft, and graft on putty  L4-5 posterior lateral arthrodesis utilizing nonsegmental pedicle screw fixation and local autografting.  Surgeon:Olamide Carattini A.Tishie Altmann, M.D.  Asst. Surgeon: Reinaldo Meeker, NP  Anesthesia: General  Indication: 59 year old female status post 2 prior lumbar disc surgeries at L4-5 with worsening mechanical back pain and chronic left lower extremity radicular pain which is failed conservative management her work-up demonstrates evidence of marked disc space collapse with significant lateral recess stenosis left greater than right and moderately severe foraminal stenosis bilaterally.  Patient presents now for lumbar decompression and fusion in hopes of improving her symptoms.  Operative note: After induction anesthesia, patient position prone on the Wilson frame appropriate padded.  Lumbar region prepped and draped sterilely.  Incision made overlying L4-5.  Dissection performed bilaterally.  Retractor placed.  Fluoroscopy used.  Levels confirmed.  Previous laminotomies were dissected free bilaterally.  The laminotomies were generously expanded to remove the inferior two thirds lamina of L4 the entire inferior facet and pars interarticularis of L4 the majority of the superior facet of L5 and the superior rim of the L5 lamina.  Ligament flavum elevated and resected as well as epidural scar.  Epidural venous plexus coagulated and cut.  Bilateral discectomies then performed.  Dissipates then prepared for interbody fusion  with a distractor placed patient's right side to space was cleaned and scraped of all soft tissue.  A 9 mm Medtronic expandable cage was then impacted in the place and expanded.  Distractor removed patient's right side.  To space prepared on the right side.  Morselized autograft was packed throughout the interspace.  A second cage was then packed into place and expanded.  Pedicles of L4 and L5 were identified using surface landmarks and intraoperative fluoroscopy and superficial bone around the pedicle was then removed using high-speed drill.  Pedicles were then probed using a pedicle awl at L4 and L5 bilaterally.  Pedicle all tracts were probed and found to be solidly within the bone.  Pedicle's were then tapped with a screw tap.  Each screw hole was probed and found to be solidly within the bone.  5.75 mm radius brand screws from [employer] were placed bilaterally at L4 and L5.  Final images reveal good position of cages and the hardware at the proper upper level with normal alignment of spine.  Wound is then irrigated with antibiotic solution.  Short segment titanium rod placed over the screws at L4 and L5.  Locking caps placed over the screw heads.  Locking caps then engaged with a contrast under compression.  Transverse processes were decorticated.  Morselized autograft was packed posterior laterally for later fusion.  Graft on putty was placed within the cages bilaterally.  Gelfoam was placed over the laminotomy defects.  Vancomycin powder was placed in deep wound space.  Wounds and closed in layers with Vicryl sutures.  Steri-Strips and sterile dressing were applied.

## 2020-07-14 DIAGNOSIS — M48061 Spinal stenosis, lumbar region without neurogenic claudication: Secondary | ICD-10-CM | POA: Diagnosis not present

## 2020-07-14 MED ORDER — DIAZEPAM 5 MG PO TABS: 5.0000 mg | ORAL_TABLET | Freq: Four times a day (QID) | ORAL | 0 refills | Status: DC | PRN

## 2020-07-14 MED ORDER — OXYCODONE HCL 10 MG PO TABS: 10.0000 mg | ORAL_TABLET | ORAL | 0 refills | Status: DC | PRN

## 2020-07-14 MED ORDER — POLYETHYLENE GLYCOL 3350 17 G PO PACK: 17.0000 g | PACK | Freq: Every day | ORAL | 0 refills | Status: DC | PRN

## 2020-07-14 NOTE — Discharge Instructions (Signed)
Wound Care Keep incision covered and dry for two days.  If you shower, cover incision with plastic wrap.  Do not put any creams, lotions, or ointments on incision. Leave steri-strips on back.  They will fall off by themselves. Activity Walk each and every day, increasing distance each day. No lifting greater than 5 lbs. No driving for 2 weeks; may ride as a passenger locally. If provided with back brace, wear when out of bed.  It is not necessary to wear brace in bed. Diet Resume your normal diet.  Return to Work Will be discussed at you follow up appointment. Call Your Doctor If Any of These Occur Redness, drainage, or swelling at the wound.  Temperature greater than 101 degrees. Severe pain not relieved by pain medication. Incision starts to come apart. Follow Up Appt Call today for appointment in 1-2 weeks ([phone]) or for problems.  If you have any hardware placed in your spine, you will need an x-ray before your appointment.

## 2020-07-14 NOTE — Progress Notes (Signed)
Patient is discharged from room 3C08 at this time. Alert and in stable condition. IV site d/c'd and instructions read to patient and spouse with understanding verbalized and all questions answered. Left unit via wheelchair with all belongings at side.

## 2020-07-14 NOTE — Evaluation (Signed)
Physical Therapy Evaluation Patient Details Name: Sierra Boone MRN: [id number] DOB: 1960/09/21 Today's Date: 07/14/2020   History of Present Illness  Patient is a 59 y/o female who presents s/p L4-5 PLIF 12/20. PMH includes prior back surgery and Fibromuscular dysplasia of both carotid arteries.  Clinical Impression  Patient presents with pain and post surgical deficits s/p above surgery. Pt is independent with ADLs/IADLs PTA and is a retired Runner, broadcasting/film/video. Today, pt tolerated bed mobility, transfers, gait training and stair training with supervision-Mod I for safety. Able to donn brace in standing without assist or difficulty. Education re: back precautions, handout, positioning, log roll technique, walking program, pillows etc. Pt has support from spouse at home. Pt does not require skilled therapy services as pt functioning at supervision-Mod I level. All education completed. Discharge from therapy.    Follow Up Recommendations No PT follow up    Equipment Recommendations  None recommended by PT    Recommendations for Other Services       Precautions / Restrictions Precautions Precautions: Back Precaution Booklet Issued: Yes (comment) Precaution Comments: Reviewed back precautions and handout Required Braces or Orthoses: Spinal Brace Spinal Brace: Lumbar corset;Applied in standing position Restrictions Weight Bearing Restrictions: No      Mobility  Bed Mobility Overal bed mobility: Needs Assistance Bed Mobility: Rolling;Sidelying to Sit Rolling: Supervision Sidelying to sit: Supervision       General bed mobility comments: HOB flat, no use of rails to simulate home. Cues for log roll technique.    Transfers Overall transfer level: Modified independent Equipment used: None             General transfer comment: Stood from EOB x1, Slow to rise with pt walking hands up knees.  Ambulation/Gait Ambulation/Gait assistance: Modified independent (Device/Increase  time) Gait Distance (Feet): 400 Feet Assistive device: None Gait Pattern/deviations: Step-through pattern;Decreased stride length   Gait velocity interpretation: 1.31 - 2.62 ft/sec, indicative of limited community ambulator General Gait Details: Slow, steady gait with no evidence of imbalance.  Stairs Stairs: Yes Stairs assistance: Supervision Stair Management: One rail Left Number of Stairs: 13 General stair comments: Cues for safety/technique.  Wheelchair Mobility    Modified Rankin (Stroke Patients Only)       Balance Overall balance assessment: No apparent balance deficits (not formally assessed)                                           Pertinent Vitals/Pain Pain Assessment: 0-10 Pain Score: 6  Pain Location: back Pain Descriptors / Indicators: Sore;Operative site guarding Pain Intervention(s): Monitored during session;Repositioned    Home Living Family/patient expects to be discharged to:: Private residence Living Arrangements: Spouse/significant other Available Help at Discharge: Family;Available PRN/intermittently Type of Home: House Home Access: Stairs to enter Entrance Stairs-Rails: Right Entrance Stairs-Number of Steps: 5-6 Home Layout: Two level Home Equipment: None      Prior Function Level of Independence: Independent         Comments: Retired Runner, broadcasting/film/video; 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 Loves to travel with spouse. Independent with ADLs./IADLs/driving.     Hand Dominance        Extremity/Trunk Assessment   Upper Extremity Assessment Upper Extremity Assessment: Defer to OT evaluation    Lower Extremity Assessment Lower Extremity Assessment: Overall WFL for tasks assessed    Cervical / Trunk Assessment Cervical / Trunk  Assessment: Other exceptions Cervical / Trunk Exceptions: s/p back surgery  Communication   Communication: No difficulties  Cognition Arousal/Alertness:  Awake/alert Behavior During Therapy: WFL for tasks assessed/performed Overall Cognitive Status: Within Functional Limits for tasks assessed                                        General Comments General comments (skin integrity, edema, etc.): Able to donn brace in sttanding without assist.    Exercises     Assessment/Plan    PT Assessment Patent does not need any further PT services  PT Problem List         PT Treatment Interventions      PT Goals (Current goals can be found in the Care Plan section)  Acute Rehab PT Goals Patient Stated Goal: to be able to care for my grandkid and travel with no pain PT Goal Formulation: All assessment and education complete, DC therapy    Frequency     Barriers to discharge        Co-evaluation               AM-PAC PT "6 Clicks" Mobility  Outcome Measure Help needed turning from your back to your side while in a flat bed without using bedrails?: None Help needed moving from lying on your back to sitting on the side of a flat bed without using bedrails?: None Help needed moving to and from a bed to a chair (including a wheelchair)?: None Help needed standing up from a chair using your arms (e.g., wheelchair or bedside chair)?: None Help needed to walk in hospital room?: None Help needed climbing 3-5 steps with a railing? : None 6 Click Score: 24    End of Session Equipment Utilized During Treatment: Back brace Activity Tolerance: Patient tolerated treatment well Patient left: in bed;with call bell/phone within reach Nurse Communication: Mobility status PT Visit Diagnosis: Pain Pain - part of body:  (back)    Time: 3299-2426 PT Time Calculation (min) (ACUTE ONLY): 24 min   Charges:   PT Evaluation $PT Eval Low Complexity: 1 Low PT Treatments $Gait Training: 8-22 mins        Marisa Severin, PT, DPT Acute Rehabilitation Services Pager [phone] Office [phone]      Marguarite Arbour A  Sabra Heck 07/14/2020, 8:57 AM

## 2020-07-14 NOTE — Discharge Summary (Signed)
Physician Discharge Summary     Providing Compassionate, Quality Care - Together   atient ID: Sierra Boone MRN: [id number] DOB/AGE: Jun 05, 1961 59 y.o.  Admit date: 07/13/2020 Discharge date: 07/14/2020  Admission Diagnoses: Lumbar foraminal stenosis  Discharge Diagnoses:  Active Problems:   Lumbar foraminal stenosis   Discharged Condition: good  Hospital Course: Patient underwent an L4-5 redo decompressive laminotomies and foraminotomies with posterior lumbar interbody fusion by Dr. Annette Stable on 07/13/2020. She was admitted to 3C08 overnight for observation. Her postoperative course has been uncomplicated. She has worked with both physical and occupational therapies who feel the patient is ready for discharge home. She is ambulating independently and without difficulty with the aid of a walker. She is tolerating a normal diet. She is not having any bowel or bladder dysfunction. Her pain is well-controlled with oral pain medication. She is ready for discharge home.    Significant Diagnostic Studies: radiology: DG Lumbar Spine 2-3 Views  Result Date: 07/13/2020 CLINICAL DATA:  Elective surgery. Additional history provided: Posterior lumbar interbody fusion lumbar 4-lumbar 5. Provided fluoroscopy time 30 seconds (27.76 mGy). EXAM: LUMBAR SPINE - 2-3 VIEW; DG C-ARM 1-60 MIN COMPARISON:  Lumbar spine MRI 05/21/2020. FINDINGS: PA and lateral view intraoperative fluoroscopic images of the lumbar spine are submitted, 2 images total. The images demonstrate bilateral pedicle screws at L4 and L5. Vertical interconnecting rods were not present at the time the images were taken. An L4-L5 interbody device is also present. Overlying retractors. IMPRESSION: Two intraoperative fluoroscopic images of the lumbar spine from L4-L5 fusion, as described. Electronically Signed   By: Kellie Simmering DO   On: 07/13/2020 10:08   DG C-Arm 1-60 Min  Result Date: 07/13/2020 CLINICAL DATA:  Elective surgery.  Additional history provided: Posterior lumbar interbody fusion lumbar 4-lumbar 5. Provided fluoroscopy time 30 seconds (27.76 mGy). EXAM: LUMBAR SPINE - 2-3 VIEW; DG C-ARM 1-60 MIN COMPARISON:  Lumbar spine MRI 05/21/2020. FINDINGS: PA and lateral view intraoperative fluoroscopic images of the lumbar spine are submitted, 2 images total. The images demonstrate bilateral pedicle screws at L4 and L5. Vertical interconnecting rods were not present at the time the images were taken. An L4-L5 interbody device is also present. Overlying retractors. IMPRESSION: Two intraoperative fluoroscopic images of the lumbar spine from L4-L5 fusion, as described. Electronically Signed   By: Kellie Simmering DO   On: 07/13/2020 10:08    Treatments: surgery: Bilateral L4-5 redo decompressive laminotomies and foraminotomies, more than would be required for simple interbody fusion alone.  L4-5 posterior lumbar interbody fusion utilizing interbody cages, morselized autograft, and graft on putty (9 mm Medtronic expandable cage)  L4-5 posterior lateral arthrodesis utilizing nonsegmental pedicle screw fixation and local autografting (Radius by [employer])  Discharge Exam: Blood pressure 111/81, pulse 99, temperature 98.5 F (36.9 C), temperature source Oral, resp. rate 16, height 5\' 7"  (1.702 m), weight 88.5 kg, last menstrual period 05/23/2012, SpO2 96 %.   Alert and oriented x 4 PERRLA CN II-XII grossly intact MAE, Strength and sensation intact Incision is covered with Honeycomb dressing and Steri Strips; Dressing is clean, dry, and intact   Disposition: Discharge disposition: 01-Home or Self Care        Allergies as of 07/14/2020   No Known Allergies     Medication List    TAKE these medications   diazepam 5 MG tablet Commonly known as: VALIUM Take 1 tablet (5 mg total) by mouth every 6 (six) hours as needed for muscle spasms.   Oxycodone HCl  10 MG Tabs Take 1 tablet (10 mg total) by mouth every 4 (four)  hours as needed for severe pain ((score 7 to 10)).   polyethylene glycol 17 g packet Commonly known as: MIRALAX / GLYCOLAX Take 17 g by mouth daily as needed for mild constipation.            Durable Medical Equipment  (From admission, onward)         Start     Ordered   07/13/20 1208  DME 3 n 1  Once        07/13/20 1207   07/13/20 1208  DME Walker rolling  Once       Question:  Patient needs a walker to treat with the following condition  Answer:  Lumbar foraminal stenosis   07/13/20 1207          Follow-up Information    Earnie Larsson, MD. Schedule an appointment as soon as possible for a visit in 2 week(s).   Specialty: Neurosurgery Contact information: [address] [place] [address] [phone]               Signed: Patricia Nettle 07/14/2020, 10:39 AM

## 2020-07-14 NOTE — Evaluation (Signed)
Occupational Therapy Evaluation Patient Details Name: Sierra Boone MRN: [id number] DOB: 10-Feb-1961 Today's Date: 07/14/2020    History of Present Illness 59 yo female s/p BIL L4-5 redo decompressive laminotomies and foraminotomies  fusion utilizing interbody cage morselized autograft and pedicle screw fixation  PMH arthritis, pupil contriction L eye, back surgx2,   Clinical Impression   Patient evaluated by Occupational Therapy with no further acute OT needs identified. All education has been completed and the patient has no further questions. See below for any follow-up Occupational Therapy or equipment needs. OT to sign off. Thank you for referral.      Follow Up Recommendations  No OT follow up    Equipment Recommendations  3 in 1 bedside commode (likely to purchase on her own)    Recommendations for Other Services       Precautions / Restrictions Precautions Precautions: Back Precaution Booklet Issued: Yes (comment) Precaution Comments: Reviewed back precautions and handout Required Braces or Orthoses: Spinal Brace Spinal Brace: Lumbar corset;Applied in standing position Restrictions Weight Bearing Restrictions: No      Mobility Bed Mobility Overal bed mobility: Modified Independent Bed Mobility: Rolling;Sidelying to Sit Rolling: Supervision Sidelying to sit: Supervision       General bed mobility comments: HOB flat, no use of rails to simulate home. Cues for log roll technique.    Transfers Overall transfer level: Modified independent Equipment used: None             General transfer comment: Stood from EOB x1, Slow to rise with pt walking hands up knees.    Balance Overall balance assessment: No apparent balance deficits (not formally assessed)                                         ADL either performed or assessed with clinical judgement   ADL Overall ADL's : Modified independent                                        General ADL Comments: return demo of adls with back precautions this session dressing UB/ LB, adl sink level toilet transfer toilet hygiene and shower transfer. plans to get 3n1 on her own   Back handout provided and reviewed adls in detail. Pt educated on: clothing between brace, never sleep in brace, set an alarm at night for medication, avoid sitting for long periods of time, correct bed positioning for sleeping, correct sequence for bed mobility, avoiding lifting more than 5 pounds and never wash directly over incision. All education is complete and patient indicates understanding.    Vision Baseline Vision/History: Wears glasses Wears Glasses: At all times       Perception     Praxis      Pertinent Vitals/Pain Pain Assessment: No/denies pain Pain Score: 6  Pain Location: back Pain Descriptors / Indicators: Sore;Operative site guarding Pain Intervention(s): Monitored during session;Repositioned     Hand Dominance Right   Extremity/Trunk Assessment Upper Extremity Assessment Upper Extremity Assessment: Overall WFL for tasks assessed   Lower Extremity Assessment Lower Extremity Assessment: Overall WFL for tasks assessed   Cervical / Trunk Assessment Cervical / Trunk Assessment: Other exceptions (s/p surg) Cervical / Trunk Exceptions: s/p back surgery   Communication Communication Communication: No difficulties   Cognition Arousal/Alertness: Awake/alert Behavior During Therapy:  WFL for tasks assessed/performed Overall Cognitive Status: Within Functional Limits for tasks assessed                                     General Comments  don doff brace mod I    Exercises     Shoulder Instructions      Home Living Family/patient expects to be discharged to:: Private residence Living Arrangements: Spouse/significant other Available Help at Discharge: Family;Available PRN/intermittently Type of Home: House Home Access: Stairs to  enter [employer] of Steps: 5-6 Entrance Stairs-Rails: Right Home Layout: Two level Alternate Level Stairs-Number of Steps: 1 flight Alternate Level Stairs-Rails: Right Bathroom Shower/Tub: Tub/shower unit;Walk-in shower   Bathroom Toilet: Standard     Home Equipment: None   Additional Comments: babysits 81mo grandson but will be taking a break from babysitting during healing time. 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 Loves to travel with spouse. Independent with ADLs./IADLs/driving.        OT Problem List: Decreased activity tolerance;Impaired balance (sitting and/or standing)      OT Treatment/Interventions:      OT Goals(Current goals can be found in the care plan section) Acute Rehab OT Goals Patient Stated Goal: to be able to take care of grandson OT Goal Formulation: All assessment and education complete, DC therapy  OT Frequency:     Barriers to D/C:            Co-evaluation              AM-PAC OT "6 Clicks" Daily Activity     Outcome Measure Help from another person eating meals?: None Help from another person taking care of personal grooming?: None Help from another person toileting, which includes using toliet, bedpan, or urinal?: None Help from another person bathing (including washing, rinsing, drying)?: None Help from another person to put on and taking off regular upper body clothing?: None Help from another person to put on and taking off regular lower body clothing?: None 6 Click Score: 24   End of Session Equipment Utilized During Treatment: Back brace Nurse Communication: Mobility status;Precautions  Activity Tolerance: Patient tolerated treatment well Patient left: in bed;with call bell/phone within reach;with family/visitor present  OT Visit  Diagnosis: Unsteadiness on feet (R26.81)                Time: 2831-5176 OT Time Calculation (min): 27 min Charges:  OT General Charges $OT Visit: 1 Visit OT Evaluation $OT Eval Moderate Complexity: 1 Mod OT Treatments $Self Care/Home Management : 8-22 mins   Brynn, OTR/L  Acute Rehabilitation Services Pager: [phone] Office: [phone] .   Jeri Modena 07/14/2020, 9:46 AM

## 2020-07-15 ENCOUNTER — Encounter (HOSPITAL_COMMUNITY): Payer: Self-pay | Admitting: Neurosurgery

## 2020-07-27 MED FILL — Sodium Chloride IV Soln 0.9%: INTRAVENOUS | Qty: 1000 | Status: AC

## 2020-07-27 MED FILL — Heparin Sodium (Porcine) Inj 1000 Unit/ML: INTRAMUSCULAR | Qty: 30 | Status: AC

## 2021-04-28 ENCOUNTER — Encounter: Payer: Self-pay | Admitting: Gastroenterology

## 2021-10-28 ENCOUNTER — Other Ambulatory Visit: Payer: Self-pay

## 2021-10-28 ENCOUNTER — Encounter (HOSPITAL_COMMUNITY): Payer: Self-pay

## 2021-10-28 ENCOUNTER — Observation Stay (HOSPITAL_COMMUNITY)
Admission: EM | Admit: 2021-10-28 | Discharge: 2021-10-29 | Disposition: A | Discharge status: Home / Self Care | Payer: BC Managed Care – PPO | Attending: Surgery | Admitting: Surgery

## 2021-10-28 ENCOUNTER — Emergency Department (HOSPITAL_COMMUNITY): Payer: BC Managed Care – PPO

## 2021-10-28 DIAGNOSIS — K8012 Calculus of gallbladder with acute and chronic cholecystitis without obstruction: Secondary | ICD-10-CM | POA: Diagnosis not present

## 2021-10-28 DIAGNOSIS — R1010 Upper abdominal pain, unspecified: Secondary | ICD-10-CM

## 2021-10-28 DIAGNOSIS — K769 Liver disease, unspecified: Secondary | ICD-10-CM | POA: Diagnosis not present

## 2021-10-28 DIAGNOSIS — K8050 Calculus of bile duct without cholangitis or cholecystitis without obstruction: Secondary | ICD-10-CM

## 2021-10-28 DIAGNOSIS — R1011 Right upper quadrant pain: Secondary | ICD-10-CM | POA: Diagnosis present

## 2021-10-28 DIAGNOSIS — R7989 Other specified abnormal findings of blood chemistry: Secondary | ICD-10-CM | POA: Diagnosis not present

## 2021-10-28 DIAGNOSIS — K810 Acute cholecystitis: Secondary | ICD-10-CM | POA: Diagnosis present

## 2021-10-28 LAB — URINALYSIS, ROUTINE W REFLEX MICROSCOPIC
Bilirubin Urine: NEGATIVE
Glucose, UA: NEGATIVE mg/dL
Hgb urine dipstick: NEGATIVE
Ketones, ur: 20 mg/dL — AB
Nitrite: NEGATIVE
Protein, ur: NEGATIVE mg/dL
Specific Gravity, Urine: 1.023 (ref 1.005–1.030)
pH: 5 (ref 5.0–8.0)

## 2021-10-28 LAB — CBC
HCT: 47.1 % — ABNORMAL HIGH (ref 36.0–46.0)
Hemoglobin: 15.2 g/dL — ABNORMAL HIGH (ref 12.0–15.0)
MCH: 28.7 pg (ref 26.0–34.0)
MCHC: 32.3 g/dL (ref 30.0–36.0)
MCV: 88.9 fL (ref 80.0–100.0)
Platelets: 304 10*3/uL (ref 150–400)
RBC: 5.3 MIL/uL — ABNORMAL HIGH (ref 3.87–5.11)
RDW: 12.7 % (ref 11.5–15.5)
WBC: 7.4 10*3/uL (ref 4.0–10.5)
nRBC: 0 % (ref 0.0–0.2)

## 2021-10-28 LAB — COMPREHENSIVE METABOLIC PANEL
ALT: 705 U/L — ABNORMAL HIGH (ref 0–44)
AST: 321 U/L — ABNORMAL HIGH (ref 15–41)
Albumin: 4.2 g/dL (ref 3.5–5.0)
Alkaline Phosphatase: 152 U/L — ABNORMAL HIGH (ref 38–126)
Anion gap: 8 (ref 5–15)
BUN: 11 mg/dL (ref 8–23)
CO2: 25 mmol/L (ref 22–32)
Calcium: 9.4 mg/dL (ref 8.9–10.3)
Chloride: 108 mmol/L (ref 98–111)
Creatinine, Ser: 0.83 mg/dL (ref 0.44–1.00)
GFR, Estimated: >60 mL/min (ref >60)
Glucose, Bld: 89 mg/dL (ref 70–99)
Potassium: 4.1 mmol/L (ref 3.5–5.1)
Sodium: 141 mmol/L (ref 135–145)
Total Bilirubin: 1.3 mg/dL — ABNORMAL HIGH (ref 0.3–1.2)
Total Protein: 7.3 g/dL (ref 6.5–8.1)

## 2021-10-28 LAB — LIPASE, BLOOD: Lipase: 28 U/L (ref 11–51)

## 2021-10-28 MED ORDER — METHOCARBAMOL 500 MG PO TABS: 500.0000 mg | ORAL_TABLET | Freq: Four times a day (QID) | ORAL | Status: DC | PRN

## 2021-10-28 MED ORDER — MORPHINE SULFATE (PF) 2 MG/ML IV SOLN: 2.0000 mg | INTRAVENOUS | Status: DC | PRN

## 2021-10-28 MED ORDER — DIPHENHYDRAMINE HCL 50 MG/ML IJ SOLN: 12.5000 mg | Freq: Four times a day (QID) | INTRAMUSCULAR | Status: DC | PRN

## 2021-10-28 MED ORDER — ENOXAPARIN SODIUM 40 MG/0.4ML IJ SOSY
40.0000 mg | PREFILLED_SYRINGE | INTRAMUSCULAR | Status: DC
  Administered 2021-10-28: 40 mg via SUBCUTANEOUS
  Filled 2021-10-28: qty 0.4

## 2021-10-28 MED ORDER — SODIUM CHLORIDE 0.9 % IV SOLN: INTRAVENOUS | Status: DC

## 2021-10-28 MED ORDER — OXYCODONE HCL 5 MG PO TABS: 5.0000 mg | ORAL_TABLET | ORAL | Status: DC | PRN

## 2021-10-28 MED ORDER — HYDRALAZINE HCL 20 MG/ML IJ SOLN: 10.0000 mg | INTRAMUSCULAR | Status: DC | PRN

## 2021-10-28 MED ORDER — ACETAMINOPHEN 325 MG PO TABS
650.0000 mg | ORAL_TABLET | Freq: Four times a day (QID) | ORAL | Status: DC | PRN
Start: 2021-10-28 — End: 2021-10-30
  Administered 2021-10-29: 650 mg via ORAL
  Filled 2021-10-28: qty 2

## 2021-10-28 MED ORDER — ONDANSETRON HCL 4 MG/2ML IJ SOLN: 4.0000 mg | Freq: Four times a day (QID) | INTRAMUSCULAR | Status: DC | PRN

## 2021-10-28 MED ORDER — BISACODYL 10 MG RE SUPP: 10.0000 mg | Freq: Every day | RECTAL | Status: DC | PRN

## 2021-10-28 MED ORDER — SODIUM CHLORIDE 0.9 % IV SOLN
2.0000 g | INTRAVENOUS | Status: DC
  Administered 2021-10-28 – 2021-10-29 (×2): 2 g via INTRAVENOUS
  Filled 2021-10-28 (×2): qty 20

## 2021-10-28 MED ORDER — DIPHENHYDRAMINE HCL 12.5 MG/5ML PO ELIX: 12.5000 mg | ORAL_SOLUTION | Freq: Four times a day (QID) | ORAL | Status: DC | PRN

## 2021-10-28 MED ORDER — SIMETHICONE 80 MG PO CHEW
40.0000 mg | CHEWABLE_TABLET | Freq: Four times a day (QID) | ORAL | Status: DC | PRN
Start: 2021-10-28 — End: 2021-10-30

## 2021-10-28 MED ORDER — ONDANSETRON 4 MG PO TBDP: 4.0000 mg | ORAL_TABLET | Freq: Four times a day (QID) | ORAL | Status: DC | PRN

## 2021-10-28 MED ORDER — DOCUSATE SODIUM 100 MG PO CAPS: 100.0000 mg | ORAL_CAPSULE | Freq: Two times a day (BID) | ORAL | Status: DC

## 2021-10-28 MED ORDER — ACETAMINOPHEN 650 MG RE SUPP: 650.0000 mg | Freq: Four times a day (QID) | RECTAL | Status: DC | PRN

## 2021-10-28 MED ORDER — MELATONIN 3 MG PO TABS: 3.0000 mg | ORAL_TABLET | Freq: Every evening | ORAL | Status: DC | PRN

## 2021-10-28 NOTE — ED Provider Notes (Signed)
61 yo here with concern for acute biliary disease, pending general surgery consult ? ?Pt received as signout from EDP Dr Reather Converse ? ?5 pm surgery PA informs me their service will be admitting th epatient ?  ?Wyvonnia Dusky, MD ?10/28/21 1654 ? ?

## 2021-10-28 NOTE — ED Provider Triage Note (Signed)
Emergency Medicine Provider Triage Evaluation Note ? ?Sierra Boone , a 61 y.o. female  was evaluated in triage.  Pt complains of right upper quadrant abdominal pain onset 4 days.  Patient notes she was evaluated at her doctor's office yesterday and had ultrasound completed that showed gallstones with a partial obstruction as well as elevated liver enzymes.  Patient denies current abdominal pain, nausea, vomiting, diarrhea, fever, chills, dysuria, hematuria, vaginal bleeding, vaginal discharge.  She has not tried medication for her symptoms.  Denies a history of similar symptoms.  ? ?Review of Systems  ?Positive: As per HPI ?Negative: Neck ? ?Physical Exam  ?BP (!) 193/104 (BP Location: Right Arm)   Pulse 70   Temp 98.4 ?F (36.9 ?C) (Oral)   Resp 16   Ht '5\' 7"'$  (1.702 m)   Wt 81.6 kg   LMP 05/23/2012   SpO2 97%   BMI 28.19 kg/m?  ?Gen:   Awake, no distress   ?Resp:  Normal effort  ?MSK:   Moves extremities without difficulty  ?Other:  Mild right upper quadrant tenderness to palpation. ? ?Medical Decision Making  ?Medically screening exam initiated at 12:52 PM.  Appropriate orders placed.  Sierra Boone was informed that the remainder of the evaluation will be completed by another provider, this initial triage assessment does not replace that evaluation, and the importance of remaining in the ED until their evaluation is complete. ? ?  ?Sierra Boone A, PA-C ?10/28/21 1300 ? ?

## 2021-10-28 NOTE — ED Triage Notes (Signed)
Pt reports she was having abd pain and went to her doctor's office yesterday. She had an US done and was called today and told to come to ER because she because the US showed gallstones with partial obstruction as well as elevated liver enzymes. Denies current abd pain/n/v/d. ?

## 2021-10-28 NOTE — ED Provider Notes (Signed)
?[facility] ?Provider Note ? ? ?CSN: [id number] ?Arrival date & time: 10/28/21  1228 ? ?  ? ?History ? ?Chief Complaint  ?Patient presents with  ? Abdominal Pain  ? ? ?Sierra Boone is a 61 y.o. female. ? ?Patient presents with recurrent upper abdominal discomfort and nausea 2 episodes this week.  Patient had blood work and ultrasound done outpatient showing gallstones and significant elevated liver function.  Patient sent in the ER for further evaluation to see general surgery.  Patient symptoms currently are mild.  No fever, no vomiting, no chills, no urinary symptoms.  No history of known gallbladder problems.  No abdominal surgery history. ? ? ?  ? ?Home Medications ?Prior to Admission medications   ?Medication Sig Start Date End Date Taking? Authorizing Provider  ?diazepam (VALIUM) 5 MG tablet Take 1 tablet (5 mg total) by mouth every 6 (six) hours as needed for muscle spasms. 07/14/20   Viona Gilmore D, NP  ?oxyCODONE 10 MG TABS Take 1 tablet (10 mg total) by mouth every 4 (four) hours as needed for severe pain ((score 7 to 10)). 07/14/20   Viona Gilmore D, NP  ?polyethylene glycol (MIRALAX / GLYCOLAX) 17 g packet Take 17 g by mouth daily as needed for mild constipation. 07/14/20   Viona Gilmore D, NP  ?   ? ?Allergies    ?Patient has no known allergies.   ? ?Review of Systems   ?Review of Systems  ?Constitutional:  Negative for chills and fever.  ?HENT:  Negative for congestion.   ?Eyes:  Negative for visual disturbance.  ?Respiratory:  Negative for shortness of breath.   ?Cardiovascular:  Negative for chest pain.  ?Gastrointestinal:  Positive for abdominal pain and nausea. Negative for vomiting.  ?Genitourinary:  Negative for dysuria and flank pain.  ?Musculoskeletal:  Negative for back pain, neck pain and neck stiffness.  ?Skin:  Negative for rash.  ?Neurological:  Negative for light-headedness and headaches.  ? ?Physical Exam ?Updated Vital Signs ?BP (!)  193/104 (BP Location: Right Arm)   Pulse 70   Temp 98.4 ?F (36.9 ?C) (Oral)   Resp 16   Ht '5\' 7"'$  (1.702 m)   Wt 81.6 kg   LMP 05/23/2012   SpO2 97%   BMI 28.19 kg/m?  ?Physical Exam ?Vitals and nursing note reviewed.  ?Constitutional:   ?   General: She is not in acute distress. ?   Appearance: She is well-developed.  ?HENT:  ?   Head: Normocephalic and atraumatic.  ?   Mouth/Throat:  ?   Mouth: Mucous membranes are moist.  ?Eyes:  ?   General:     ?   Right eye: No discharge.     ?   Left eye: No discharge.  ?   Conjunctiva/sclera: Conjunctivae normal.  ?Neck:  ?   Trachea: No tracheal deviation.  ?Cardiovascular:  ?   Rate and Rhythm: Normal rate and regular rhythm.  ?   Heart sounds: No murmur heard. ?Pulmonary:  ?   Effort: Pulmonary effort is normal.  ?   Breath sounds: Normal breath sounds.  ?Abdominal:  ?   General: There is no distension.  ?   Palpations: Abdomen is soft.  ?   Tenderness: There is abdominal tenderness in the right upper quadrant. There is no guarding.  ?Musculoskeletal:  ?   Cervical back: Normal range of motion and neck supple. No rigidity.  ?Skin: ?   General: Skin is warm.  ?  Capillary Refill: Capillary refill takes less than 2 seconds.  ?   Findings: No rash.  ?Neurological:  ?   General: No focal deficit present.  ?   Mental Status: She is alert.  ?   Cranial Nerves: No cranial nerve deficit.  ?Psychiatric:     ?   Mood and Affect: Mood normal.  ? ? ?ED Results / Procedures / Treatments   ?Labs ?(all labs ordered are listed, but only abnormal results are displayed) ?Labs Reviewed  ?COMPREHENSIVE METABOLIC PANEL - Abnormal; Notable for the following components:  ?    Result Value  ? AST 321 (*)   ? ALT 705 (*)   ? Alkaline Phosphatase 152 (*)   ? Total Bilirubin 1.3 (*)   ? All other components within normal limits  ?CBC - Abnormal; Notable for the following components:  ? RBC 5.30 (*)   ? Hemoglobin 15.2 (*)   ? HCT 47.1 (*)   ? All other components within normal limits   ?LIPASE, BLOOD  ?URINALYSIS, ROUTINE W REFLEX MICROSCOPIC  ? ? ?EKG ?None ? ?Radiology ?US Abdomen Limited RUQ (LIVER/GB) ? ?Result Date: 10/28/2021 ?CLINICAL DATA:  Right upper quadrant pain EXAM: ULTRASOUND ABDOMEN LIMITED RIGHT UPPER QUADRANT COMPARISON:  None. FINDINGS: Gallbladder: Sludge with subcentimeter stone. Wall thickness measures up to 3 mm. Positive sonographic Murphy sign noted by sonographer. No pericholecystic fluid. Common bile duct: Diameter: 4 mm, within normal limits. Liver: 1 cm hyperechoic lesion the right lobe. Adjacent 2.5 cm cyst. Within normal limits in parenchymal echogenicity. Portal vein is patent on color Doppler imaging with normal direction of blood flow towards the liver. Other: None. IMPRESSION: Gallbladder sludge and subcentimeter stone with positive sonographic Murphy sign. Equivocal for acute cholecystitis. Consider nuclear study. 1 cm hyperechoic liver lesion likely reflects a hemangioma in the absence of malignancy. This could be confirmed with a nonemergent outpatient MRI. Electronically Signed   By: Macy Mis M.D.   On: 10/28/2021 14:17   ? ?Procedures ?Procedures  ? ? ?Medications Ordered in ED ?Medications - No data to display ? ?ED Course/ Medical Decision Making/ A&P ?  ?                        ?Medical Decision Making ?Amount and/or Complexity of Data Reviewed ?Labs: ordered. ? ? ?Patient presents with clinical concern for acute biliary disease likely biliary colic given no fever, no active vomiting and mild pain.  Blood work reviewed independently showing normal white count, normal hemoglobin, liver function 321 and 705 respectively, bilirubin 1.3 ALT elevated. ? ?Ultrasound results reviewed showing gallstone, sludge no obvious wall thickening. ? ?Discussed with patient, patient's husband details and and discussed with general surgery for consult in the ER.  Patient care signed out to follow-up recommendations. ? ? ? ? ? ? ? ?Final Clinical Impression(s) / ED  Diagnoses ?Final diagnoses:  ?Biliary colic  ?Upper abdominal pain  ?LFT elevation  ?Liver lesion  ? ? ?Rx / DC Orders ?ED Discharge Orders   ? ? None  ? ?  ? ? ?  ?Elnora Morrison, MD ?10/28/21 1618 ? ?

## 2021-10-28 NOTE — H&P (Addendum)
? ? ? ?Jeannine Boga ?[birth year]  ?[id number].   ? ?Requesting MD: Dr. Elnora Morrison ?Chief Complaint/Reason for Consult: RUQ abdominal pain, possible acute cholecystitis  ? ?HPI: Sierra Boone is a 61 y.o. female who presented to the ED for abdominal pain. Patient reports that she has had 3 self limiting episodes of epigastric and RUQ abdominal pain with associate n/v last < 20 minutes between Monday at [place] and Tuesday at 4am. She saw her PCP yesterday and had lab work/US ordered. Her labs showed WBC 5.1, ALT >1000, AST 902, Alk Phos 187 and T. Bili 1.2. [place] w/ GB sludge w/ subcentimeter stone, 12mm GB wall thickness, no pericholecystitic fluid and + Murphy's sign. She was directed to the ED for evaluation. Labs today showed WBC 7.4, AST 321, ALT 705, Alk Phos 152, T. Bili 1.3 and Lipase wnl. She currently denies any fever, chills, abdominal pain, n/v/d, constipation, dysuria. She is afebrile without tachycardia or hypotension in the ED (she is actually hypertensive). No hx of prior abdominal surgeries. She drinks 2 alcoholic beverages per week and has not had any alcoholic beverages in the last 1 week. I am unable to see the notes from her PCP. She is not on anticoagulation. 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  ? ?ROS: ?Review of Systems  ?Constitutional:  Negative for chills and fever.  ?Respiratory:  Negative for shortness of breath.   ?Cardiovascular:  Negative for chest pain and leg swelling.  ?Gastrointestinal:  Positive for abdominal pain, nausea and vomiting. Negative for constipation and diarrhea.  ?Genitourinary: Negative.   ?Psychiatric/Behavioral:  Negative for substance abuse.   ? ?Family History  ?Problem Relation Age of Onset  ? Glaucoma Brother   ? Glaucoma Maternal Grandfather   ? ? ?Past Medical History:  ?Diagnosis Date  ? Arthritis   ? back  ? Fibromuscular dysplasia of both carotid arteries ([place])   ? Pneumonia   ? as a child  ?  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 ? ?Past Surgical History:  ?Procedure Laterality Date  ? BACK SURGERY    ? lower back (L5) x2  ? COLONOSCOPY  24years ago  ? in [place](Normal exam)  ? ? ?Social History:  reports that she has never smoked. She has never used smokeless tobacco. She reports current alcohol use of about 4.0 standard drinks per week. She reports that she does not use drugs. 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 ?Allergies: No Known Allergies ? ?(Not in a hospital admission) ? ? ? ?Physical Exam: ?Blood pressure (!) 193/104, pulse 70, temperature 98.4 ?F (36.9 ?C), temperature source Oral, resp. rate 16, height $RemoveBe'5\' 7"'CntKhmJBr$  (1.702 m), weight 81.6 kg, last menstrual period 05/23/2012, SpO2 97 %. ?General: pleasant, WD/WN female who is laying in bed in NAD ?HEENT: head is normocephalic, atraumatic.  Sclera are noninjected.  PERRL.  Ears and nose without any masses or lesions.  Mouth is pink and moist. Dentition fair ?Heart: regular, rate, and rhythm.  Normal s1,s2. No obvious murmurs, gallops, or rubs noted.  Palpable pedal pulses bilaterally  ?Lungs: CTAB, no wheezes, rhonchi, or rales noted.  Respiratory effort nonlabored ?Abd:  Soft, very mild epigastric and RUQ tenderness, ND, +BS, no masses, hernias, or organomegaly ?MS: no BUE/BLE edema, calves soft and nontender ?Skin: warm and dry with no masses, lesions, or rashes ?Psych: A&Ox4 with an appropriate  affect ?Neuro: cranial nerves grossly intact, equal strength in BUE/BLE bilaterally, normal speech, thought process intact, moves all extremities, gait not assessed ? ? ?Results for orders placed or performed during the hospital encounter of 10/28/21 (from the past 48 hour(s))  ?Lipase, blood     Status: None  ? Collection Time: 10/28/21 12:50 PM  ?Result Value Ref Range  ? Lipase 28 11 - 51 U/L  ?  Comment: Performed at [facility], [place] [address], [place], [place] [address]  ?Comprehensive  metabolic panel     Status: Abnormal  ? Collection Time: 10/28/21 12:50 PM  ?Result Value Ref Range  ? Sodium 141 135 - 145 mmol/L  ? Potassium 4.1 3.5 - 5.1 mmol/L  ? Chloride 108 98 - 111 mmol/L  ? CO2 25 22 - 32 mmol/L  ? Glucose, Bld 89 70 - 99 mg/dL  ?  Comment: Glucose reference range applies only to samples taken after fasting for at least 8 hours.  ? BUN 11 8 - 23 mg/dL  ? Creatinine, Ser 0.83 0.44 - 1.00 mg/dL  ? Calcium 9.4 8.9 - 10.3 mg/dL  ? Total Protein 7.3 6.5 - 8.1 g/dL  ? Albumin 4.2 3.5 - 5.0 g/dL  ? AST 321 (H) 15 - 41 U/L  ? ALT 705 (H) 0 - 44 U/L  ? Alkaline Phosphatase 152 (H) 38 - 126 U/L  ? Total Bilirubin 1.3 (H) 0.3 - 1.2 mg/dL  ? GFR, Estimated >60 >60 mL/min  ?  Comment: (NOTE) ?Calculated using the CKD-EPI Creatinine Equation (2021) ?  ? Anion gap 8 5 - 15  ?  Comment: Performed at [facility], [place] [address], [place], [place] [address]  ?CBC     Status: Abnormal  ? Collection Time: 10/28/21 12:50 PM  ?Result Value Ref Range  ? WBC 7.4 4.0 - 10.5 K/uL  ? RBC 5.30 (H) 3.87 - 5.11 MIL/uL  ? Hemoglobin 15.2 (H) 12.0 - 15.0 g/dL  ? HCT 47.1 (H) 36.0 - 46.0 %  ? MCV 88.9 80.0 - 100.0 fL  ? MCH 28.7 26.0 - 34.0 pg  ? MCHC 32.3 30.0 - 36.0 g/dL  ? RDW 12.7 11.5 - 15.5 %  ? Platelets 304 150 - 400 K/uL  ? nRBC 0.0 0.0 - 0.2 %  ?  Comment: Performed at [facility], [place] [address] [place], [place] [address]  ? ?US Abdomen Limited RUQ (LIVER/GB) ? ?Result Date: 10/28/2021 ?CLINICAL DATA:  Right upper quadrant pain EXAM: ULTRASOUND ABDOMEN LIMITED RIGHT UPPER QUADRANT COMPARISON:  None. FINDINGS: Gallbladder: Sludge with subcentimeter stone. Wall thickness measures up to 3 mm. Positive sonographic Murphy sign noted by sonographer. No pericholecystic fluid. Common bile duct: Diameter: 4 mm, within normal limits. Liver: 1 cm hyperechoic lesion the right lobe. Adjacent 2.5 cm cyst. Within normal limits in parenchymal echogenicity. Portal vein is patent on color Doppler imaging with normal  direction of blood flow towards the liver. Other: None. IMPRESSION: Gallbladder sludge and subcentimeter stone with positive sonographic Murphy sign. Equivocal for acute cholecystitis. Consider nuclear study. 1 cm hyperechoic liver lesion likely reflects a hemangioma in the absence of malignancy. This could be confirmed with a nonemergent outpatient MRI. Electronically Signed   By: Macy Mis M.D.   On: 10/28/2021 14:17   ? ?Anti-infectives (From admission, onward)  ? ? None  ? ?  ? ? ?Assessment/Plan ?Possible Acute Cholecystitis  ?Elevated LFT's ?- This is a 61 y.o. female w/  2 episodes of epigastric/RUQ abdominal pain this week. Her labs show WBC and Lipase WNL, ALT >1000 -> 705, AST 902 -> 321, Alk Phos 187 -> 152 and T. Bili 1.2 -> 1.2. US shows GB sludge w/ subcentimeter stone, 31mm GB wall thickness, no pericholecystitic fluid and + Murphy's sign. She is currently without any abdominal pain and only has mild epigastric/RUQ tenderness on exam. Suspect she may have passed a stone. Plan for admission to outpatient, IV abx, repeat labs in the AM w/ tentative plan for Lap Chole w/ IOC in the AM.  ?-  I have explained the procedure, risks, and aftercare of Laparoscopic cholecystectomy with IOC.  Risks include but are not limited to anesthesia (MI, CVA, death), bleeding, infection, wound problems, hernia, bile leak, injury to common bile duct/liver/intestine, increased risk of DVT/PE and diarrhea post op.  She seems to understand and agrees to proceed. ? ?FEN - CLD. NPO at midnight. IVF ?VTE - SCDs, Lovenox  ?ID - Rocephin ?Foley - None  ?Dispo - Admit to outpatient observation.  ? ?HTN - No hx/home meds. Repeat now. Denies cp or sob. PRN Hydralazine  ? ?I reviewed nursing notes, ED provider notes, last 24 h vitals and pain scores, last 48 h intake and output, last 24 h labs and trends, and last 24 h imaging results. ? ? ?Jillyn Ledger, PA-C ?[facility] ?10/28/2021, 3:57 PM ?Please see Amion for  pager number during day hours 7:00am-4:30pm ? ?

## 2021-10-29 ENCOUNTER — Other Ambulatory Visit: Payer: Self-pay

## 2021-10-29 ENCOUNTER — Encounter (HOSPITAL_COMMUNITY)
Admission: EM | Disposition: A | Discharge status: Home / Self Care | Payer: Self-pay | Source: Home / Self Care | Attending: Emergency Medicine

## 2021-10-29 ENCOUNTER — Encounter (HOSPITAL_COMMUNITY): Payer: Self-pay

## 2021-10-29 ENCOUNTER — Observation Stay (HOSPITAL_COMMUNITY): Payer: BC Managed Care – PPO | Admitting: Certified Registered Nurse Anesthetist

## 2021-10-29 ENCOUNTER — Observation Stay (HOSPITAL_COMMUNITY): Payer: BC Managed Care – PPO

## 2021-10-29 HISTORY — PX: CHOLECYSTECTOMY: SHX55

## 2021-10-29 LAB — COMPREHENSIVE METABOLIC PANEL
ALT: 478 U/L — ABNORMAL HIGH (ref 0–44)
AST: 175 U/L — ABNORMAL HIGH (ref 15–41)
Albumin: 3.4 g/dL — ABNORMAL LOW (ref 3.5–5.0)
Alkaline Phosphatase: 119 U/L (ref 38–126)
Anion gap: 6 (ref 5–15)
BUN: 9 mg/dL (ref 8–23)
CO2: 25 mmol/L (ref 22–32)
Calcium: 8.8 mg/dL — ABNORMAL LOW (ref 8.9–10.3)
Chloride: 109 mmol/L (ref 98–111)
Creatinine, Ser: 0.83 mg/dL (ref 0.44–1.00)
GFR, Estimated: >60 mL/min (ref >60)
Glucose, Bld: 86 mg/dL (ref 70–99)
Potassium: 3.9 mmol/L (ref 3.5–5.1)
Sodium: 140 mmol/L (ref 135–145)
Total Bilirubin: 1.4 mg/dL — ABNORMAL HIGH (ref 0.3–1.2)
Total Protein: 6 g/dL — ABNORMAL LOW (ref 6.5–8.1)

## 2021-10-29 LAB — CBC
HCT: 41.1 % (ref 36.0–46.0)
Hemoglobin: 13.5 g/dL (ref 12.0–15.0)
MCH: 29 pg (ref 26.0–34.0)
MCHC: 32.8 g/dL (ref 30.0–36.0)
MCV: 88.4 fL (ref 80.0–100.0)
Platelets: 246 10*3/uL (ref 150–400)
RBC: 4.65 MIL/uL (ref 3.87–5.11)
RDW: 12.6 % (ref 11.5–15.5)
WBC: 7.9 10*3/uL (ref 4.0–10.5)
nRBC: 0 % (ref 0.0–0.2)

## 2021-10-29 LAB — HIV ANTIBODY (ROUTINE TESTING W REFLEX): HIV Screen 4th Generation wRfx: NONREACTIVE

## 2021-10-29 SURGERY — LAPAROSCOPIC CHOLECYSTECTOMY WITH INTRAOPERATIVE CHOLANGIOGRAM
Anesthesia: General | Site: Abdomen

## 2021-10-29 MED ORDER — BUPIVACAINE HCL (PF) 0.25 % IJ SOLN
INTRAMUSCULAR | Status: AC
  Filled 2021-10-29: qty 30

## 2021-10-29 MED ORDER — BUPIVACAINE-EPINEPHRINE (PF) 0.25% -1:200000 IJ SOLN
INTRAMUSCULAR | Status: DC | PRN
  Administered 2021-10-29: 30 mL

## 2021-10-29 MED ORDER — FENTANYL CITRATE (PF) 250 MCG/5ML IJ SOLN
INTRAMUSCULAR | Status: AC
  Filled 2021-10-29: qty 5

## 2021-10-29 MED ORDER — SODIUM CHLORIDE 0.9 % IV SOLN: INTRAVENOUS | Status: DC

## 2021-10-29 MED ORDER — CHLORHEXIDINE GLUCONATE 0.12 % MT SOLN: 15.0000 mL | Freq: Once | OROMUCOSAL | Status: AC

## 2021-10-29 MED ORDER — CHLORHEXIDINE GLUCONATE 0.12 % MT SOLN
OROMUCOSAL | Status: AC
  Administered 2021-10-29: 15 mL via OROMUCOSAL
  Filled 2021-10-29: qty 15

## 2021-10-29 MED ORDER — ONDANSETRON HCL 4 MG/2ML IJ SOLN
INTRAMUSCULAR | Status: DC | PRN
  Administered 2021-10-29: 4 mg via INTRAVENOUS

## 2021-10-29 MED ORDER — SODIUM CHLORIDE 0.9 % IR SOLN
Status: DC | PRN
  Administered 2021-10-29: 1000 mL

## 2021-10-29 MED ORDER — HYDROMORPHONE HCL 1 MG/ML IJ SOLN
INTRAMUSCULAR | Status: AC
  Filled 2021-10-29: qty 1

## 2021-10-29 MED ORDER — LIDOCAINE 2% (20 MG/ML) 5 ML SYRINGE
INTRAMUSCULAR | Status: AC
  Filled 2021-10-29: qty 5

## 2021-10-29 MED ORDER — MIDAZOLAM HCL 2 MG/2ML IJ SOLN
INTRAMUSCULAR | Status: DC | PRN
  Administered 2021-10-29: 2 mg via INTRAVENOUS

## 2021-10-29 MED ORDER — EPHEDRINE SULFATE-NACL 50-0.9 MG/10ML-% IV SOSY
PREFILLED_SYRINGE | INTRAVENOUS | Status: DC | PRN
  Administered 2021-10-29: 10 mg via INTRAVENOUS

## 2021-10-29 MED ORDER — SUGAMMADEX SODIUM 200 MG/2ML IV SOLN
INTRAVENOUS | Status: DC | PRN
Start: 2021-10-29 — End: 2021-10-29
  Administered 2021-10-29: 200 mg via INTRAVENOUS

## 2021-10-29 MED ORDER — ONDANSETRON HCL 4 MG/2ML IJ SOLN
INTRAMUSCULAR | Status: AC
  Filled 2021-10-29: qty 2

## 2021-10-29 MED ORDER — DEXAMETHASONE SODIUM PHOSPHATE 10 MG/ML IJ SOLN
INTRAMUSCULAR | Status: AC
  Filled 2021-10-29: qty 1

## 2021-10-29 MED ORDER — ROCURONIUM BROMIDE 10 MG/ML (PF) SYRINGE
PREFILLED_SYRINGE | INTRAVENOUS | Status: DC | PRN
  Administered 2021-10-29: 60 mg via INTRAVENOUS

## 2021-10-29 MED ORDER — ORAL CARE MOUTH RINSE: 15.0000 mL | Freq: Once | OROMUCOSAL | Status: AC

## 2021-10-29 MED ORDER — LACTATED RINGERS IV SOLN: INTRAVENOUS | Status: DC | PRN

## 2021-10-29 MED ORDER — SUCCINYLCHOLINE CHLORIDE 200 MG/10ML IV SOSY
PREFILLED_SYRINGE | INTRAVENOUS | Status: DC | PRN
  Administered 2021-10-29: 120 mg via INTRAVENOUS

## 2021-10-29 MED ORDER — FENTANYL CITRATE (PF) 250 MCG/5ML IJ SOLN
INTRAMUSCULAR | Status: DC | PRN
  Administered 2021-10-29: 100 ug via INTRAVENOUS
  Administered 2021-10-29: 50 ug via INTRAVENOUS
  Administered 2021-10-29: 100 ug via INTRAVENOUS

## 2021-10-29 MED ORDER — OXYCODONE-ACETAMINOPHEN 5-325 MG PO TABS: 1.0000 | ORAL_TABLET | ORAL | 0 refills | Status: DC | PRN

## 2021-10-29 MED ORDER — ROCURONIUM BROMIDE 10 MG/ML (PF) SYRINGE
PREFILLED_SYRINGE | INTRAVENOUS | Status: AC
  Filled 2021-10-29: qty 10

## 2021-10-29 MED ORDER — 0.9 % SODIUM CHLORIDE (POUR BTL) OPTIME
TOPICAL | Status: DC | PRN
  Administered 2021-10-29: 1000 mL

## 2021-10-29 MED ORDER — KETOROLAC TROMETHAMINE 30 MG/ML IJ SOLN
INTRAMUSCULAR | Status: DC | PRN
  Administered 2021-10-29: 30 mg via INTRAVENOUS

## 2021-10-29 MED ORDER — DEXAMETHASONE SODIUM PHOSPHATE 10 MG/ML IJ SOLN
INTRAMUSCULAR | Status: DC | PRN
  Administered 2021-10-29: 10 mg via INTRAVENOUS

## 2021-10-29 MED ORDER — LIDOCAINE 2% (20 MG/ML) 5 ML SYRINGE
INTRAMUSCULAR | Status: DC | PRN
  Administered 2021-10-29: 70 mg via INTRAVENOUS

## 2021-10-29 MED ORDER — EPHEDRINE 5 MG/ML INJ
INTRAVENOUS | Status: AC
  Filled 2021-10-29: qty 5

## 2021-10-29 MED ORDER — HYDROMORPHONE HCL 1 MG/ML IJ SOLN
0.2500 mg | INTRAMUSCULAR | Status: DC | PRN
  Administered 2021-10-29 (×2): 0.5 mg via INTRAVENOUS

## 2021-10-29 MED ORDER — IOHEXOL 300 MG/ML  SOLN
INTRAMUSCULAR | Status: DC | PRN
Start: 2021-10-29 — End: 2021-10-29
  Administered 2021-10-29: 20 mL

## 2021-10-29 MED ORDER — BUPIVACAINE-EPINEPHRINE (PF) 0.25% -1:200000 IJ SOLN
INTRAMUSCULAR | Status: AC
  Filled 2021-10-29: qty 30

## 2021-10-29 MED ORDER — MIDAZOLAM HCL 2 MG/2ML IJ SOLN
INTRAMUSCULAR | Status: AC
  Filled 2021-10-29: qty 2

## 2021-10-29 MED ORDER — PROPOFOL 10 MG/ML IV BOLUS
INTRAVENOUS | Status: AC
  Filled 2021-10-29: qty 20

## 2021-10-29 MED ORDER — PROPOFOL 10 MG/ML IV BOLUS
INTRAVENOUS | Status: DC | PRN
  Administered 2021-10-29: 40 mg via INTRAVENOUS
  Administered 2021-10-29: 160 mg via INTRAVENOUS

## 2021-10-29 SURGICAL SUPPLY — 52 items
ADH SKN CLS APL DERMABOND .7 (GAUZE/BANDAGES/DRESSINGS) ×1
APL PRP STRL LF DISP 70% ISPRP (MISCELLANEOUS) ×1
APPLIER CLIP ROT 10 11.4 M/L (STAPLE) ×2
BAG COUNTER SPONGE SURGICOUNT (BAG) ×2 IMPLANT
BAG SPEC RTRVL 10 TROC 200 (ENDOMECHANICALS) ×1
CANISTER SUCT 3000ML PPV (MISCELLANEOUS) ×2 IMPLANT
CATH URET 5FR 28IN OPEN ENDED (CATHETERS) ×2 IMPLANT
CHLORAPREP W/TINT 26 (MISCELLANEOUS) ×2 IMPLANT
CLIP APPLIE ROT 10 11.4 M/L (STAPLE) ×1 IMPLANT
COVER MAYO STAND STRL (DRAPES) ×2 IMPLANT
COVER SURGICAL LIGHT HANDLE (MISCELLANEOUS) ×2 IMPLANT
DERMABOND ADVANCED (GAUZE/BANDAGES/DRESSINGS) ×1
DERMABOND ADVANCED .7 DNX12 (GAUZE/BANDAGES/DRESSINGS) ×1 IMPLANT
DRAPE C-ARM 42X120 X-RAY (DRAPES) ×2 IMPLANT
ELECT REM PT RETURN 9FT ADLT (ELECTROSURGICAL) ×2
ELECTRODE REM PT RTRN 9FT ADLT (ELECTROSURGICAL) ×1 IMPLANT
ENDOLOOP SUT PDS II  0 18 (SUTURE) ×2
ENDOLOOP SUT PDS II 0 18 (SUTURE) IMPLANT
GLOVE SRG 8 PF TXTR STRL LF DI (GLOVE) ×1 IMPLANT
GLOVE SURG ENC MOIS LTX SZ7.5 (GLOVE) ×2 IMPLANT
GLOVE SURG UNDER POLY LF SZ8 (GLOVE) ×2
GOWN STRL REUS W/ TWL LRG LVL3 (GOWN DISPOSABLE) ×2 IMPLANT
GOWN STRL REUS W/ TWL XL LVL3 (GOWN DISPOSABLE) ×1 IMPLANT
GOWN STRL REUS W/TWL LRG LVL3 (GOWN DISPOSABLE) ×4
GOWN STRL REUS W/TWL XL LVL3 (GOWN DISPOSABLE) ×2
GRASPER SUT TROCAR 14GX15 (MISCELLANEOUS) ×2 IMPLANT
IRRIG SUCT STRYKERFLOW 2 WTIP (MISCELLANEOUS) ×2
IRRIGATION SUCT STRKRFLW 2 WTP (MISCELLANEOUS) ×1 IMPLANT
IV CATH 14GX2 1/4 (CATHETERS) ×2 IMPLANT
IV CATH AUTO 14GX1.75 SAFE ORG (IV SOLUTION) ×2 IMPLANT
KIT BASIN OR (CUSTOM PROCEDURE TRAY) ×2 IMPLANT
KIT TURNOVER KIT B (KITS) ×2 IMPLANT
NDL INSUFFLATION 14GA 120MM (NEEDLE) ×1 IMPLANT
NEEDLE INSUFFLATION 14GA 120MM (NEEDLE) ×2 IMPLANT
NS IRRIG 1000ML POUR BTL (IV SOLUTION) ×2 IMPLANT
PAD ARMBOARD 7.5X6 YLW CONV (MISCELLANEOUS) ×2 IMPLANT
POUCH RETRIEVAL ECOSAC 10 (ENDOMECHANICALS) ×1 IMPLANT
POUCH RETRIEVAL ECOSAC 10MM (ENDOMECHANICALS) ×2
SCISSORS LAP 5X35 DISP (ENDOMECHANICALS) ×2 IMPLANT
SET CHOLANGIOGRAPH 5 50 .035 (SET/KITS/TRAYS/PACK) ×2 IMPLANT
SET TUBE SMOKE EVAC HIGH FLOW (TUBING) ×2 IMPLANT
SLEEVE ENDOPATH XCEL 5M (ENDOMECHANICALS) ×4 IMPLANT
SPECIMEN JAR SMALL (MISCELLANEOUS) ×2 IMPLANT
STOPCOCK 4 WAY LG BORE MALE ST (IV SETS) ×2 IMPLANT
SUT MNCRL AB 4-0 PS2 18 (SUTURE) ×2 IMPLANT
TOWEL GREEN STERILE (TOWEL DISPOSABLE) ×2 IMPLANT
TOWEL GREEN STERILE FF (TOWEL DISPOSABLE) ×2 IMPLANT
TRAY LAPAROSCOPIC MC (CUSTOM PROCEDURE TRAY) ×2 IMPLANT
TROCAR XCEL NON-BLD 11X100MML (ENDOMECHANICALS) ×2 IMPLANT
TROCAR XCEL NON-BLD 5MMX100MML (ENDOMECHANICALS) ×2 IMPLANT
WARMER LAPAROSCOPE (MISCELLANEOUS) ×2 IMPLANT
WATER STERILE IRR 1000ML POUR (IV SOLUTION) ×2 IMPLANT

## 2021-10-29 NOTE — Transfer of Care (Signed)
Immediate Anesthesia Transfer of Care Note ? ?Patient: Sierra Boone ? ?Procedure(s) Performed: LAPAROSCOPIC CHOLECYSTECTOMY WITH INTRAOPERATIVE CHOLANGIOGRAM (Abdomen) ? ?Patient Location: PACU ? ?Anesthesia Type:General ? ?Level of Consciousness: awake, alert  and oriented ? ?Airway & Oxygen Therapy: Patient Spontanous Breathing and Patient connected to nasal cannula oxygen ? ?Post-op Assessment: Report given to RN and Post -op Vital signs reviewed and stable ? ?Post vital signs: Reviewed and stable ? ?Last Vitals:  ?Vitals Value Taken Time  ?BP 141/81 10/29/21 1219  ?Temp 36.5 ?C 10/29/21 1218  ?Pulse 64 10/29/21 1224  ?Resp 13 10/29/21 1224  ?SpO2 95 % 10/29/21 1224  ?Vitals shown include unvalidated device data. ? ?Last Pain:  ?Vitals:  ? 10/29/21 1218  ?TempSrc:   ?PainSc: 2   ?   ? ?  ? ?Complications: No notable events documented. ?

## 2021-10-29 NOTE — Anesthesia Postprocedure Evaluation (Signed)
Anesthesia Post Note ? ?Patient: Sierra Boone ? ?Procedure(s) Performed: LAPAROSCOPIC CHOLECYSTECTOMY WITH INTRAOPERATIVE CHOLANGIOGRAM (Abdomen) ? ?  ? ?Patient location during evaluation: PACU ?Anesthesia Type: General ?Level of consciousness: awake and alert, oriented and patient cooperative ?Pain management: pain level controlled ?Vital Signs Assessment: post-procedure vital signs reviewed and stable ?Respiratory status: spontaneous breathing, nonlabored ventilation and respiratory function stable ?Cardiovascular status: blood pressure returned to baseline and stable ?Postop Assessment: no apparent nausea or vomiting ?Anesthetic complications: no ? ? ?No notable events documented. ? ?Last Vitals:  ?Vitals:  ? 10/29/21 1303 10/29/21 1318  ?BP: (!) 157/88 (!) 147/87  ?Pulse: (!) 57 (!) 58  ?Resp: 15 13  ?Temp:    ?SpO2: 97% 95%  ?  ?Last Pain:  ?Vitals:  ? 10/29/21 1318  ?TempSrc:   ?PainSc: 5   ? ? ?  ?  ?  ?  ?  ?  ? ?Jarome Matin Roxsana Riding ? ? ? ? ?

## 2021-10-29 NOTE — Op Note (Addendum)
? ?Patient: Sierra Boone ([birth year], [id number]) ? ?Date of Surgery: 10/29/21 ? ?Preoperative Diagnosis: acute cholecystitis  ? ?Postoperative Diagnosis: acute cholecystitis  ? ?Surgical Procedure: LAPAROSCOPIC CHOLECYSTECTOMY WITH INTRAOPERATIVE CHOLANGIOGRAM: 03500 (CPT?)  ? ?Operative Team Members:  ?Surgeon(s) and Role: ?   * Solimar Maiden, Nickola Major, MD - Primary  ? ?Anesthesiologist: Christell Faith, MD ?CRNA: Valda Favia, CRNA; Renato Shin, CRNA  ? ?Anesthesia: General  ? ?Fluids:  ?No intake/output data recorded. ? ?Complications: None ? ?Drains:  none  ? ?Specimen:  ?ID Type Source Tests Collected by Time Destination  ?1 : Gallbladder Tissue PATH Gallbladder SURGICAL PATHOLOGY Najat Olazabal, Nickola Major, MD 10/29/2021 1129   ?  ? ?Disposition:  PACU - hemodynamically stable. ? ?Plan of Care: Discharge to home after PACU ? ? ? ?Indications for Procedure: Sierra Boone is a 61 y.o. female who presented with abdominal pain.  History, physical and imaging was concerning for choledocholithiasis.  Laparoscopic cholecystectomy was recommended for the patient.  The procedure itself, as well as the risks, benefits and alternatives were discussed with the patient.  Risks discussed included but were not limited to the risk of infection, bleeding, damage to nearby structures, need to convert to open procedure, incisional hernia, bile leak, common bile duct injury and the need for additional procedures or surgeries.  With this discussion complete and all questions answered the patient granted consent to proceed. ? ?Findings: Choledocholith at ampula on initial IOC, able to flush duct and clear stone with normal IOC saved to chart ? ?Infection status: ?Patient: Sierra Boone Emergency General Surgery Service Patient ?Case: Emergent ?Infection Present At Time Of Surgery (PATOS):  Some spillage of bile related to performing a cholangiogram ? ? ?Description of Procedure:  ? ?On the date  stated above, the patient was taken to the operating room suite and placed in supine positioning.  Sequential compression devices were placed on the lower extremities to prevent blood clots.  General endotracheal anesthesia was induced. Preoperative antibiotics were given.  The patient's abdomen was prepped and draped in the usual sterile fashion.  A time-out was completed verifying the correct patient, procedure, positioning and equipment needed for the case. ? ?We began by anesthetizing the skin with local anesthetic and then making a 5 mm incision just below the umbilicus.  We dissected through the subcutaneous tissues to the fascia.  The fascia was grasped and elevated using a Kocher clamp.  A Veress needle was inserted into the abdomen and the abdomen was insufflated to 15 mmHg.  A 5 mm trocar was inserted in this position under optical guidance and then the abdomen was inspected.  There was no trauma to the underlying viscera with initial trocar placement.  Any abnormal findings, other than inflammation in the right upper quadrant, are listed above in the findings section.  Three additional trocars were placed, one 12 mm trocar in the subxiphoid position, one 5 mm trocar in the midline epigastric area and one 109m trocar in the right upper quadrant subcostally.  These were placed under direct vision without any trauma to the underlying viscera.   ? ?The patient was then placed in head up, left side down positioning.  The gallbladder was identified and dissected free from its attachments to the omentum allowing the duodenum to fall away.  The infundibulum of the gallbladder was dissected free working laterally to medially.  The cystic duct and cystic artery were dissected free from surrounding connective tissue.  The infundibulum  of the gallbladder was dissected off the cystic plate.  A critical view of safety was obtained with the cystic duct and cystic artery being cleared of connective tissues and clearly the  only two structures entering into the gallbladder with the liver clearly visible behind.  One clip was applied high on the cystic duct.  A small ductotomy was created below this using the endoscopic shears.  A cholangiogram catheter was introduced through the abdominal wall and into the cystic duct through this ductotomy.  The catheter was clipped into position.  The catheter was flushed to ensure no leakage around the clip.  We then removed the laparoscopic instruments and positioned the C-Arm to perform a cholangiogram.  The catheter was flushed with contrast under fluoroscopic visualization and a cholangiogram was obtained.  The cholangiogram visualized the biliary tree from the ampulla up to the first two biliary radicals in the liver.  There was a filling defect at the ampula so 20cc of saline was flushed through the catheter and the cholangiogram was then taken again with contrast injection and then there were no filling defects identified.  The catheter clearly entered the cystic duct.  There was gradual tapering of the common bile duct down to the ampulla without evidence of stricture or other abnormalities.  Please see the EMR for saved representative images.  With our cholangiogram compete, we moved the c-arm away from the field and returned to laparoscopic surgery.   ? ?Clips were then applied to the cystic duct and cystic artery and then these structures were divided.  An endoloop was placed on the cystic duct stump.  The gallbladder was dissected off the cystic plate, placed in an endocatch bag and removed from the 12 mm subxiphoid port site.  The clips were inspected and appeared effective.  The cystic plate was inspected and hemostasis was obtained using electrocautery.  A suction irrigator was used to clean the operative field.  Attention was turned to closure.  The 12 mm subxiphoid port site was closed using a 0-vicryl suture on a fascial suture passer.  The abdomen was desufflated.  The skin was  closed using 4-0 monocryl and dermabond.  All sponge and needle counts were correct at the conclusion of the case. ? ? ? ?Sierra Raw, MD ?General, Bariatric, & Minimally Invasive Surgery ?[facility], [place] ? ?

## 2021-10-29 NOTE — ED Notes (Signed)
Report handed off to [facility].  ?

## 2021-10-29 NOTE — ED Notes (Signed)
Pt ambulatory to restroom independently. Even steady gait. Respirations are even and non-labored  ?Pt denies need for pain medications at this time  ? ?Call light within reach   ?

## 2021-10-29 NOTE — Progress Notes (Signed)
Ms. Badami likely passed a choledocholith.  I recommend laparoscopic cholecystectomy with intra-operative cholangiogram.  Procedure, risks, benefits and alternatives discussed and patient granted consent to proceed.  Will proceed as scheduled. ? ?Felicie Morn, MD ?General, Bariatric and Minimally Invasive Surgery ?[facility], [place] ? ?

## 2021-10-29 NOTE — Discharge Summary (Signed)
?  Patient ID: ?Sierra Boone ?[id number] ?61 y.o. ?October 15, 1960 ? ?10/28/2021 ? ?Discharge date and time: 10/29/2021 ? ?Admitting Physician: Sierra Boone ? ?Discharge Physician: Sierra Boone ? ?Admission Diagnoses: Acute cholecystitis [K81.0] ?Biliary colic [W41.32] ?Liver lesion [K76.9] ?Upper abdominal pain [R10.10] ?LFT elevation [R79.89] ?Patient Active Problem List  ? Diagnosis Date Noted  ? Acute cholecystitis 10/28/2021  ? Lumbar foraminal stenosis 07/13/2020  ? MRSA (methicillin resistant Staphylococcus aureus) colonization 07/11/2013  ? ? ? ?Discharge Diagnoses: Choledocholithiasis ?Patient Active Problem List  ? Diagnosis Date Noted  ? Acute cholecystitis 10/28/2021  ? Lumbar foraminal stenosis 07/13/2020  ? MRSA (methicillin resistant Staphylococcus aureus) colonization 07/11/2013  ? ? ?Operations: Procedure(s): ?LAPAROSCOPIC CHOLECYSTECTOMY WITH INTRAOPERATIVE CHOLANGIOGRAM ? ?Admission Condition: good ? ?Discharged Condition: good ? ?Indication for Admission: Choledocholithiasis ? ?Hospital Course: Sierra Boone presented with choledocholithiasis, underwent laparoscopic cholecystectomy with IOC which was able to flush the choledocholith out of the common bile duct.  She was discharged after surgery. ? ?Consults: None ? ?Significant Diagnostic Studies: None ? ?Treatments: surgery: as above ? ?Disposition: Home ? ?Patient Instructions:  ?Allergies as of 10/29/2021   ?No Known Allergies ?  ? ?  ?Medication List  ?  ? ?TAKE these medications   ? ?diazepam 5 MG tablet ?Commonly known as: VALIUM ?Take 1 tablet (5 mg total) by mouth every 6 (six) hours as needed for muscle spasms. ?  ?famotidine 40 MG tablet ?Commonly known as: PEPCID ?Take 40 mg by mouth daily. ?  ?Lumify 0.025 % Soln ?Generic drug: Brimonidine Tartrate ?Place 1 drop into both eyes daily. ?  ?Oxycodone HCl 10 MG Tabs ?Take 1 tablet (10 mg total) by mouth every 4 (four) hours as needed for severe pain ((score 7 to 10)). ?   ?oxyCODONE-acetaminophen 5-325 MG tablet ?Commonly known as: Percocet ?Take 1 tablet by mouth every 4 (four) hours as needed for severe pain. ?  ?polyethylene glycol 17 g packet ?Commonly known as: MIRALAX / GLYCOLAX ?Take 17 g by mouth daily as needed for mild constipation. ?  ? ?  ? ? ?Activity: no heavy lifting for 4 weeks ?Diet: regular diet ?Wound Care: keep wound clean and dry ? ?Follow-up:  With Dr. Thermon Boone in 4 weeks. ? ?Signed: ?Sierra Boone ?General, Bariatric, & Minimally Invasive Surgery ?[facility], [place] ? ? ?10/29/2021, 12:18 PM ? ?

## 2021-10-29 NOTE — Discharge Instructions (Signed)
 CHOLECYSTECTOMY POST OPERATIVE INSTRUCTIONS  Thinking Clearly  The anesthesia may cause you to feel different for 1 or 2 days. Do not drive, drink alcohol, or make any big decisions for at least 2 days.  Nutrition When you wake up, you will be able to drink small amounts of liquid. If you do not feel sick, you can slowly advance your diet to regular foods. Continue to drink lots of fluids, usually about 8 to 10 glasses per day. Eat a high-fiber diet so you don't strain during bowel movements. High-Fiber Foods Foods high in fiber include beans, bran cereals and whole-grain breads, peas, dried fruit (figs, apricots, and dates), raspberries, blackberries, strawberries, sweet corn, broccoli, baked potatoes with skin, plums, pears, apples, greens, and nuts. Activity Slowly increase your activity. Be sure to get up and walk every hour or so to prevent blood clots. No heavy lifting or strenuous activity for 4 weeks following surgery to prevent hernias at your incision sites It is normal to feel tired. You may need more sleep than usual.  Get your rest but make sure to get up and move around frequently to prevent blood clots and pneumonia.  Work and Return to School You can go back to work when you feel well enough. Discuss the timing with your surgeon. You can usually go back to school or work 1 week after an operation. If your work requires heavy lifting or strenuous activity you need to be placed on light duty for 4 weeks following surgery. You can return to gym class, sports or other physical activities 4 weeks after surgery.  Wound Care Always wash your hands before and after touching near your incision site. Do not soak in a bathtub until cleared at your follow up appointment. You may take a shower 24 hours after surgery. A small amount of drainage from the incision is normal. If the drainage is thick and yellow or the site is red, you may have an infection, so call your surgeon. If you  have a drain in one of your incisions, it will be taken out in office when the drainage stops. Steri-Strips will fall off in 7 to 10 days or they will be removed during your first office visit. If you have dermabond glue covering over the incision, allow the glue to flake off on its own. Avoid wearing tight or rough clothing. It may rub your incisions and make it harder for them to heal. Protect the new skin, especially from the sun. The sun can burn and cause darker scarring. Your scar will heal in about 4 to 6 weeks and will become softer and continue to fade over the next year.  The cosmetic appearance of the incisions will improve over the course of the first year after surgery. Sensation around your incision will return in a few weeks or months.  Bowel Movements After intestinal surgery, you may have loose watery stools for several days. If watery diarrhea lasts longer than 3 days, contact your surgeon. Pain medication (narcotics) can cause constipation. Increase the fiber in your diet with high-fiber foods if you are constipated. You can take an over the counter stool softener like Colace to avoid constipation.  Additional over the counter medications can also be used if Colace isn't sufficient (for example, Milk of Magnesia or Miralax).  Pain The amount of pain is different for each person. Some people need only 1 to 3 doses of pain control medication, while others need more. Take alternating doses of tylenol   and ibuprofen around the clock for the first five days following surgery.  This will provide a baseline of pain control and help with inflammation.  Take the narcotic pain medication in addition if needed for severe pain.  Contact Your Surgeon at [phone], if you have: Pain in your right upper abdomen like a gallbladder attack. Pain that will not go away Pain that gets worse A fever of more than 101F (38.3C) Repeated vomiting Swelling, redness, bleeding, or bad-smelling  drainage from your wound site Strong abdominal pain No bowel movement or unable to pass gas for 3 days Watery diarrhea lasting longer than 3 days  Pain Control The goal of pain control is to minimize pain, keep you moving and help you heal. Your surgical team will work with you on your pain plan. Most often a combination of therapies and medications are used to control your pain. You may also be given medication (local anesthetic) at the surgical site. This may help control your pain for several days. Extreme pain puts extra stress on your body at a time when your body needs to focus on healing. Do not wait until your pain has reached a level "10" or is unbearable before telling your doctor or nurse. It is much easier to control pain before it becomes severe. Following a laparoscopic procedure, pain is sometimes felt in the shoulder. This is due to the gas inserted into your abdomen during the procedure. Moving and walking helps to decrease the gas and the right shoulder pain.  Use the guide below for ways to manage your post-operative pain. Learn more by going to facs.org/safepaincontrol.  How Intense Is My Pain Common Therapies to Feel Better       I hardly notice my pain, and it does not interfere with my activities.  I notice my pain and it distracts me, but I can still do activities (sitting up, walking, standing).  Non-Medication Therapies  Ice (in a bag, applied over clothing at the surgical site), elevation, rest, meditation, massage, distraction (music, TV, play) walking and mild exercise Splinting the abdomen with pillows +  Non-Opioid Medications Acetaminophen (Tylenol) Non-steroidal anti-inflammatory drugs (NSAIDS) Aspirin, Ibuprofen (Motrin, Advil) Naproxen (Aleve) Take these as needed, when you feel pain. Both acetaminophen and NSAIDs help to decrease pain and swelling (inflammation).      My pain is hard to ignore and is more noticeable even when I rest.  My  pain interferes with my usual activities.  Non-Medication Therapies  +  Non-Opioid medications  Take on a regular schedule (around-the-clock) instead of as needed. (For example, Tylenol every 6 hours at 9:00 am, 3:00 pm, 9:00 pm, 3:00 am and Motrin every 6 hours at 12:00 am, 6:00 am, 12:00 pm, 6:00 pm)         I am focused on my pain, and I am not doing my daily activities.  I am groaning in pain, and I cannot sleep. I am unable to do anything.  My pain is as bad as it could be, and nothing else matters.  Non-Medication Therapies  +  Around-the-Clock Non-Opioid Medications  +  Short-acting opioids  Opioids should be used with other medications to manage severe pain. Opioids block pain and give a feeling of euphoria (feel high). Addiction, a serious side effect of opioids, is rare with short-term (a few days) use.  Examples of short-acting opioids include: Tramadol (Ultram), Hydrocodone (Norco, Vicodin), Hydromorphone (Dilaudid), Oxycodone (Oxycontin)     The above directions have been adapted from   the American College of [employer]geons Surgical Patient Education Program.  Please refer to the ACS website if needed: https://www.facs.org/-/media/files/education/patient-ed/cholesys.ashx.   Sierra Iglesia, MD [facility] [address], [address], [place], [place]  [address] ?  [address] [address], [place], [place]   [address] [phone] ? [phone] ? FAX [phone] Web site: [email or web address]  

## 2021-10-29 NOTE — ED Notes (Signed)
Pt resting in bed with eyes closed, reparations are even and non-labored. Skin is warm, dry and intact. NAD  ? ?Call light within reach  ?

## 2021-10-29 NOTE — Anesthesia Procedure Notes (Signed)
Procedure Name: Intubation ?Date/Time: 10/29/2021 11:12 AM ?Performed by: Valda Favia, CRNA ?Pre-anesthesia Checklist: Patient identified, Emergency Drugs available, Suction available and Patient being monitored ?Patient Re-evaluated:Patient Re-evaluated prior to induction ?Oxygen Delivery Method: Circle system utilized ?Preoxygenation: Pre-oxygenation with 100% oxygen ?Induction Type: IV induction ?Ventilation: Mask ventilation without difficulty ?Laryngoscope Size: Robertshaw and 3 ?Grade View: Grade I ?Tube type: Oral ?Tube size: 7.0 mm ?Number of attempts: 1 ?Airway Equipment and Method: Stylet and Oral airway ?Placement Confirmation: ETT inserted through vocal cords under direct vision, positive ETCO2 and breath sounds checked- equal and bilateral ?Secured at: 21 cm ?Tube secured with: Tape ?Dental Injury: Teeth and Oropharynx as per pre-operative assessment  ?Difficulty Due To: Difficult Airway- due to reduced neck mobility and Difficult Airway-  due to neck instability ?Comments: 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 CSpine neutral throughout.  ? ? ? ? ?

## 2021-10-29 NOTE — Anesthesia Preprocedure Evaluation (Signed)
Anesthesia Evaluation  ?Patient identified by MRN, date of birth, ID band ?Patient awake ? ? ? ?Reviewed: ?Allergy & Precautions, NPO status , Patient's Chart, lab work & pertinent test results ? ?Airway ?Mallampati: II ? ?TM Distance: >3 FB ? ? ? ? Dental ?  ?Pulmonary ?pneumonia,  ?  ?breath sounds clear to auscultation ? ? ? ? ? ? Cardiovascular ?negative cardio ROS ? ? ?Rhythm:Regular Rate:Normal ? ? ?  ?Neuro/Psych ?  ? GI/Hepatic ?Neg liver ROS, History noted ?Dr. Nyoka Cowden ?  ?Endo/Other  ?negative endocrine ROS ? Renal/GU ?negative Renal ROS  ? ?  ?Musculoskeletal ? ? Abdominal ?  ?Peds ? Hematology ?  ?Anesthesia Other Findings ? ? Reproductive/Obstetrics ? ?  ? ? ? ? ? ? ? ? ? ? ? ? ? ?  ?  ? ? ? ? ? ? ? ? ?Anesthesia Physical ?Anesthesia Plan ? ?ASA: 2 ? ?Anesthesia Plan: General  ? ?Post-op Pain Management:   ? ?Induction: Intravenous ? ?PONV Risk Score and Plan: 3 and Ondansetron, Dexamethasone and Midazolam ? ?Airway Management Planned: Oral ETT ? ?Additional Equipment:  ? ?Intra-op Plan:  ? ?Post-operative Plan:  ? ?Informed Consent: I have reviewed the patients History and Physical, chart, labs and discussed the procedure including the risks, benefits and alternatives for the proposed anesthesia with the patient or authorized representative who has indicated his/her understanding and acceptance.  ? ? ? ?Dental advisory given ? ?Plan Discussed with: CRNA and Anesthesiologist ? ?Anesthesia Plan Comments:   ? ? ? ? ? ? ?Anesthesia Quick Evaluation ? ?

## 2021-10-30 ENCOUNTER — Encounter (HOSPITAL_COMMUNITY): Payer: Self-pay | Admitting: Surgery

## 2021-11-01 LAB — SURGICAL PATHOLOGY

## 2022-05-03 ENCOUNTER — Encounter: Payer: Self-pay | Admitting: Pulmonary Disease

## 2022-05-03 ENCOUNTER — Ambulatory Visit (INDEPENDENT_AMBULATORY_CARE_PROVIDER_SITE_OTHER): Payer: BC Managed Care – PPO | Admitting: Pulmonary Disease

## 2022-05-03 VITALS — BP 120/78 | HR 62 | Temp 98.4°F | Ht 67.0 in | Wt 198.2 lb

## 2022-05-03 DIAGNOSIS — J45991 Cough variant asthma: Secondary | ICD-10-CM

## 2022-05-03 DIAGNOSIS — R053 Chronic cough: Secondary | ICD-10-CM

## 2022-05-03 MED ORDER — ALBUTEROL SULFATE HFA 108 (90 BASE) MCG/ACT IN AERS: 2.0000 | INHALATION_SPRAY | Freq: Four times a day (QID) | RESPIRATORY_TRACT | 6 refills | Status: AC | PRN

## 2022-05-03 NOTE — Patient Instructions (Addendum)
  X spirometry  X HRCT chest to r/o ILD  The commonest cause is upper airway cough syndrome (UACS, post nasal drip); other causes include cough variant asthma & GERD.  Next time you have this cough, Trial of chlorpheniramine 4 mg at bedtime x 3 weeks + phenylephrine 10 mg daytime  (combination [facility] brand 'sinus PE')  Albuterol MDI - 2puffs as needed for wheezing/coughing

## 2022-05-03 NOTE — Progress Notes (Signed)
   Subjective:    Patient ID: Sierra Boone, female    DOB: 27-May-1961, 61 y.o.   MRN: [id number]  HPI   Chief Complaint  Patient presents with  . Consult    Pt states she has a chronic cough that she gets multiple times a year. States that her cough is worse throughout the day.       Past Medical History:  Diagnosis Date  . Arthritis    back  . Fibromuscular dysplasia of both carotid arteries ([place])   . Pneumonia    as a child  . 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

## 2022-05-04 DIAGNOSIS — R053 Chronic cough: Secondary | ICD-10-CM | POA: Insufficient documentation

## 2022-05-04 NOTE — Assessment & Plan Note (Signed)
I doubt she has cough variant asthma.  She certainly could have intermittent postnasal drip or GERD. Not sure that she needs maintenance therapy for this HRCT chest to r/o ILD  The commonest cause is upper airway cough syndrome (UACS, post nasal drip); other causes include cough variant asthma & GERD.  Next time you have this cough, Trial of chlorpheniramine 4 mg at bedtime x 3 weeks + phenylephrine 10 mg daytime  (combination [facility] brand 'sinus PE')  Albuterol MDI - 2puffs as needed for wheezing/coughing

## 2022-05-09 ENCOUNTER — Ambulatory Visit
Admission: RE | Admit: 2022-05-09 | Discharge: 2022-05-09 | Disposition: A | Discharge status: Home / Self Care | Payer: BC Managed Care – PPO | Source: Ambulatory Visit | Attending: Pulmonary Disease | Admitting: Pulmonary Disease

## 2022-05-09 DIAGNOSIS — J45991 Cough variant asthma: Secondary | ICD-10-CM

## 2022-05-25 ENCOUNTER — Ambulatory Visit (AMBULATORY_SURGERY_CENTER): Payer: Self-pay | Admitting: *Deleted

## 2022-05-25 VITALS — Ht 67.0 in | Wt 180.0 lb

## 2022-05-25 DIAGNOSIS — Z1211 Encounter for screening for malignant neoplasm of colon: Secondary | ICD-10-CM

## 2022-05-25 MED ORDER — NA SULFATE-K SULFATE-MG SULF 17.5-3.13-1.6 GM/177ML PO SOLN: 1.0000 | Freq: Once | ORAL | 0 refills | Status: AC

## 2022-05-25 NOTE — Progress Notes (Signed)
Pt's previsit is done over the phone and all paperwork (prep instructions, blank consent form to just read over) sent to patient.  Pt's name and DOB verified at the beginning of the previsit.  Pt denies any difficulty with ambulating.   No egg or soy allergy known to patient  No issues known to pt with past sedation with any surgeries or procedures Patient denies ever being told they had issues or difficulty with intubation  No FH of Malignant Hyperthermia Pt is not on diet pills Pt is not on  home 02  Pt is not on blood thinners  Pt denies issues with constipation  Pt encouraged to use to use Singlecare or Goodrx to reduce cost  Prep instructions sent via MyChart and Goodrx coupon mailed

## 2022-06-20 ENCOUNTER — Encounter: Payer: Self-pay | Admitting: Certified Registered Nurse Anesthetist

## 2022-06-22 ENCOUNTER — Encounter: Payer: Self-pay | Admitting: Gastroenterology

## 2022-06-27 ENCOUNTER — Ambulatory Visit (AMBULATORY_SURGERY_CENTER): Payer: BC Managed Care – PPO | Admitting: Gastroenterology

## 2022-06-27 ENCOUNTER — Encounter: Payer: Self-pay | Admitting: Gastroenterology

## 2022-06-27 VITALS — BP 117/86 | HR 67 | Temp 98.9°F | Resp 14 | Ht 67.0 in | Wt 180.0 lb

## 2022-06-27 DIAGNOSIS — K621 Rectal polyp: Secondary | ICD-10-CM | POA: Diagnosis not present

## 2022-06-27 DIAGNOSIS — Z1211 Encounter for screening for malignant neoplasm of colon: Secondary | ICD-10-CM | POA: Diagnosis present

## 2022-06-27 DIAGNOSIS — D128 Benign neoplasm of rectum: Secondary | ICD-10-CM

## 2022-06-27 MED ORDER — SODIUM CHLORIDE 0.9 % IV SOLN: 500.0000 mL | Freq: Once | INTRAVENOUS | Status: DC

## 2022-06-27 MED ORDER — ONDANSETRON HCL 4 MG PO TABS: 4.0000 mg | ORAL_TABLET | ORAL | 0 refills | Status: AC

## 2022-06-27 MED ORDER — NA SULFATE-K SULFATE-MG SULF 17.5-3.13-1.6 GM/177ML PO SOLN: 1.0000 | Freq: Once | ORAL | 0 refills | Status: AC

## 2022-06-27 NOTE — Progress Notes (Signed)
Report given to PACU, vss 

## 2022-06-27 NOTE — Progress Notes (Signed)
No egg or soy allergy known to patient  No issues known to pt with past sedation with any surgeries or procedures Patient denies ever being told they had issues or difficulty with intubation  No FH of Malignant Hyperthermia Pt is not on diet pills Pt is not on  home 02  Pt is not on blood thinners  Pt denies issues with constipation  No A fib or A flutter Have any cardiac testing pending--no    Pt instructed to use Singlecare.com or GoodRx for a price reduction on prep   2 day mira/suprep used with zofran prior to prep doses

## 2022-06-27 NOTE — Progress Notes (Signed)
Pt's states no medical or surgical changes since previsit or office visit. 

## 2022-06-27 NOTE — Patient Instructions (Signed)
- Continue present medications. - Await pathology results. - Repeat colonoscopy at the next available appointment because the bowel preparation was poor. Plan two day bowel prep at that time. - Follow a high fiber diet. Drink at least 64 ounces of water daily. Add a daily stool bulking agent such as psyllium (an exampled would be Metamucil). - Emerging evidence supports eating a diet of fruits, vegetables, grains, calcium, and yogurt while reducing red meat and alcohol may reduce the risk of colon cancer. - Thank you for allowing me to be involved in your colon cancer prevention.  YOU HAD AN ENDOSCOPIC PROCEDURE TODAY AT [facility]:   Refer to the procedure report that was given to you for any specific questions about what was found during the examination.  If the procedure report does not answer your questions, please call your gastroenterologist to clarify.  If you requested that your care partner not be given the details of your procedure findings, then the procedure report has been included in a sealed envelope for you to review at your convenience later.  YOU SHOULD EXPECT: Some feelings of bloating in the abdomen. Passage of more gas than usual.  Walking can help get rid of the air that was put into your GI tract during the procedure and reduce the bloating. If you had a lower endoscopy (such as a colonoscopy or flexible sigmoidoscopy) you may notice spotting of blood in your stool or on the toilet paper. If you underwent a bowel prep for your procedure, you may not have a normal bowel movement for a few days.  Please Note:  You might notice some irritation and congestion in your nose or some drainage.  This is from the oxygen used during your procedure.  There is no need for concern and it should clear up in a day or so.  SYMPTOMS TO REPORT IMMEDIATELY:  Following lower endoscopy (colonoscopy):  Excessive amounts of blood in the stool  Significant tenderness or worsening  of abdominal pains  Swelling of the abdomen that is new, acute  Fever of 100F or higher  For urgent or emergent issues, a gastroenterologist can be reached at any hour by calling [phone]. Do not use MyChart messaging for urgent concerns.    DIET:  We do recommend a small meal at first, but then you may proceed to your regular diet.  Drink plenty of fluids but you should avoid alcoholic beverages for 24 hours.  ACTIVITY:  You should plan to take it easy for the rest of today and you should NOT DRIVE or use heavy machinery until tomorrow (because of the sedation medicines used during the test).    FOLLOW UP: Our staff will call the number listed on your records the next business day following your procedure.  We will call around 7:15- 8:00 am to check on you and address any questions or concerns that you may have regarding the information given to you following your procedure. If we do not reach you, we will leave a message.     If any biopsies were taken you will be contacted by phone or by letter within the next 1-3 weeks.  Please call us at [phone] if you have not heard about the biopsies in 3 weeks.    SIGNATURES/CONFIDENTIALITY: You and/or your care partner have signed paperwork which will be entered into your electronic medical record.  These signatures attest to the fact that that the information above on your After Visit Summary  has been reviewed and is understood.  Full responsibility of the confidentiality of this discharge information lies with you and/or your care-partner. 

## 2022-06-27 NOTE — Progress Notes (Signed)
   Referring Provider: Merrilee Seashore, MD Primary Care Physician:  Merrilee Seashore, MD  Indication for Procedure:  Colon cancer Surveillance   IMPRESSION:  Need for colon cancer surveillance Appropriate candidate for monitored anesthesia care  PLAN: Colonoscopy in the [place] today   HPI: Sierra Boone is a 61 y.o. female presents for surveillance colonoscopy.  Prior endoscopic history: Colonoscopy 2012 with Dr. Olevia Perches: redundant colon, diverticulosis  No baseline GI symptoms.   No known family history of colon cancer or polyps. No family history of uterine/endometrial cancer, pancreatic cancer or gastric/stomach cancer.   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 (Patient not taking: Reported on 05/25/2022) 8 g 6   Brimonidine Tartrate (LUMIFY) 0.025 % SOLN Place 1 drop into both eyes daily.     rosuvastatin (CRESTOR) 20 MG tablet Take 20 mg by mouth daily.     No current facility-administered medications for this visit.    Allergies as of 06/27/2022   (No Known Allergies)    Family History  Problem Relation Age of Onset   Glaucoma Brother    Glaucoma Maternal Grandfather    Colon cancer  Neg Hx    Esophageal cancer Neg Hx    Rectal cancer Neg Hx    Stomach cancer Neg Hx      Physical Exam: General:   Alert,  well-nourished, pleasant and cooperative in NAD Head:  Normocephalic and atraumatic. Eyes:  Sclera clear, no icterus.   Conjunctiva pink. Mouth:  No deformity or lesions.   Neck:  Supple; no masses or thyromegaly. Lungs:  Clear throughout to auscultation.   No wheezes. Heart:  Regular rate and rhythm; no murmurs. Abdomen:  Soft, non-tender, nondistended, normal bowel sounds, no rebound or guarding.  Msk:  Symmetrical. No boney deformities LAD: No inguinal or umbilical LAD Extremities:  No clubbing or edema. Neurologic:  Alert and  oriented x4;  grossly nonfocal Skin:  No obvious rash or bruise. Psych:  Alert and cooperative. Normal mood and affect.     Studies/Results: No results found.    [name] L. Tarri Glenn, MD, MPH 06/27/2022, 1:56 PM

## 2022-06-27 NOTE — Progress Notes (Signed)
Called to room to assist during endoscopic procedure.  Patient ID and intended procedure confirmed with present staff. Received instructions for my participation in the procedure from the performing physician.  

## 2022-06-27 NOTE — Op Note (Addendum)
[facility] Patient Name: Sierra Boone Procedure Date: 06/27/2022 2:28 PM MRN: [id number] Endoscopist: Thornton Park MD, MD, [id number] Age: 61 Referring MD:  Date of Birth: 06-Dec-1960 Gender: Female Account #: [id number] Procedure:                Colonoscopy Indications:              Screening for colorectal malignant neoplasm                           Normal colonoscopy with Dr. Olevia Perches in 2012 Medicines:                Monitored Anesthesia Care Procedure:                Pre-Anesthesia Assessment:                           - Prior to the procedure, a History and Physical                            was performed, and patient medications and                            allergies were reviewed. The patient's tolerance of                            previous anesthesia was also reviewed. The risks                            and benefits of the procedure and the sedation                            options and risks were discussed with the patient.                            All questions were answered, and informed consent                            was obtained. Prior Anticoagulants: The patient has                            taken no anticoagulant or antiplatelet agents. ASA                            Grade Assessment: II - A patient with mild systemic                            disease. After reviewing the risks and benefits,                            the patient was deemed in satisfactory condition to                            undergo the procedure.  After obtaining informed consent, the colonoscope                            was passed under direct vision. Throughout the                            procedure, the patient's blood pressure, pulse, and                            oxygen saturations were monitored continuously. The                            CF HQ190L #[id number] was introduced through the anus                            and advanced to  the the cecum, identified by                            appendiceal orifice and ileocecal valve. The                            colonoscopy was performed with moderate difficulty                            due to poor bowel prep and a redundant colon.                            Successful completion of the procedure was aided by                            changing the patient's position, withdrawing and                            reinserting the scope and applying abdominal                            pressure. The patient tolerated the procedure well.                            The quality of the bowel preparation was                            inadequate. The ileocecal valve, appendiceal                            orifice, and rectum were photographed. Scope In: 2:32:08 PM Scope Out: 2:46:05 PM Scope Withdrawal Time: 0 hours 8 minutes 37 seconds  Total Procedure Duration: 0 hours 13 minutes 57 seconds  Findings:                 The perianal and digital rectal examinations were                            normal.  Non-bleeding internal hemorrhoids were found.                           Multiple medium-mouthed and small-mouthed                            diverticula were found in the sigmoid colon and                            descending colon.                           A 3 mm polyp was found in the rectum. The polyp was                            flat. The polyp was removed with a cold snare.                            Resection and retrieval were complete. Estimated                            blood loss was minimal.                           A moderate amount of stool was found in the                            transverse colon, at the hepatic flexure and in the                            ascending colon, interfering with visualization.                            Small, medium, and flat polyps could be missed due                            to the retained  stool. Complications:            No immediate complications. Estimated Blood Loss:     Estimated blood loss was minimal. Impression:               - Non-bleeding internal hemorrhoids.                           - Diverticulosis in the sigmoid colon and in the                            descending colon.                           - One 3 mm polyp in the rectum, removed with a cold                            snare. Resected and retrieved.                           -  Stool in the transverse colon, at the hepatic                            flexure and in the ascending colon. Recommendation:           - Patient has a contact number available for                            emergencies. The signs and symptoms of potential                            delayed complications were discussed with the                            patient. Return to normal activities tomorrow.                            Written discharge instructions were provided to the                            patient.                           - High fiber diet.                           - Continue present medications.                           - Await pathology results.                           - Repeat colonoscopy at the next available                            appointment because the bowel preparation was poor.                            Plan two day bowel prep at that time.                           - Follow a high fiber diet. Drink at least 64                            ounces of water daily. Add a daily stool bulking                            agent such as psyllium (an exampled would be                            Metamucil).                           - Emerging evidence supports eating a diet of  fruits, vegetables, grains, calcium, and yogurt                            while reducing red meat and alcohol may reduce the                            risk of colon cancer.                           -  Thank you for allowing me to be involved in your                            colon cancer prevention. Thornton Park MD, MD 06/27/2022 2:53:47 PM This report has been signed electronically.

## 2022-06-28 ENCOUNTER — Telehealth: Payer: Self-pay

## 2022-06-28 NOTE — Telephone Encounter (Signed)
Left message on follow up call. 

## 2022-06-29 ENCOUNTER — Encounter: Payer: Self-pay | Admitting: Gastroenterology

## 2022-06-29 ENCOUNTER — Ambulatory Visit (AMBULATORY_SURGERY_CENTER): Payer: BC Managed Care – PPO | Admitting: Gastroenterology

## 2022-06-29 VITALS — BP 136/98 | HR 61 | Temp 97.7°F | Resp 12 | Ht 67.0 in | Wt 180.0 lb

## 2022-06-29 DIAGNOSIS — Z86010 Personal history of colonic polyps: Secondary | ICD-10-CM | POA: Diagnosis not present

## 2022-06-29 DIAGNOSIS — Z09 Encounter for follow-up examination after completed treatment for conditions other than malignant neoplasm: Secondary | ICD-10-CM

## 2022-06-29 DIAGNOSIS — Z1211 Encounter for screening for malignant neoplasm of colon: Secondary | ICD-10-CM

## 2022-06-29 MED ORDER — SODIUM CHLORIDE 0.9 % IV SOLN: 500.0000 mL | Freq: Once | INTRAVENOUS | Status: DC

## 2022-06-29 NOTE — Op Note (Signed)
[facility] Patient Name: Sierra Boone Procedure Date: 06/29/2022 2:08 PM MRN: [id number] Endoscopist: Thornton Park MD, MD, [id number] Age: 61 Referring MD:  Date of Birth: 1961-05-25 Gender: Female Account #: [id number] Procedure:                Colonoscopy Indications:              Screening for colorectal malignant neoplasm                           Colonoscopy 2012 with Dr. Olevia Perches: redundant colon,                            diverticulosis                           Colonoscopy 06/27/22: rectal polyp, left sided                            diverticulosis, poor prep - pathology results from                            the polyp are still pending Medicines:                Monitored Anesthesia Care Procedure:                Pre-Anesthesia Assessment:                           - Prior to the procedure, a History and Physical                            was performed, and patient medications and                            allergies were reviewed. The patient's tolerance of                            previous anesthesia was also reviewed. The risks                            and benefits of the procedure and the sedation                            options and risks were discussed with the patient.                            All questions were answered, and informed consent                            was obtained. Prior Anticoagulants: The patient has                            taken no anticoagulant or antiplatelet agents. ASA  Grade Assessment: II - A patient with mild systemic                            disease. After reviewing the risks and benefits,                            the patient was deemed in satisfactory condition to                            undergo the procedure.                           After obtaining informed consent, the colonoscope                            was passed under direct vision. Throughout the                             procedure, the patient's blood pressure, pulse, and                            oxygen saturations were monitored continuously. The                            Olympus CF-HQ190L (Serial# [id number]) Colonoscope was                            introduced through the anus and advanced to the 3                            cm into the ileum. A second forward view of the                            right colon was performed. The colonoscopy was                            technically difficult and complex due to a                            redundant colon, significant looping and a tortuous                            colon. Successful completion of the procedure was                            aided by changing the patient's position,                            withdrawing and reinserting the scope and applying                            abdominal pressure. The patient tolerated the  procedure well. The quality of the bowel                            preparation was good. The terminal ileum, ileocecal                            valve, appendiceal orifice, and rectum were                            photographed. Scope In: 2:13:31 PM Scope Out: 2:38:34 PM Scope Withdrawal Time: 0 hours 9 minutes 54 seconds  Total Procedure Duration: 0 hours 25 minutes 3 seconds  Findings:                 The perianal and digital rectal examinations were                            normal.                           Multiple medium-mouthed and small-mouthed                            diverticula were found in the sigmoid colon and                            distal descending colon.                           A diffuse area of mild melanosis was found in the                            entire colon.                           The colon is tortuous and redundant. The exam was                            otherwise without abnormality on direct and                            retroflexion  views. Complications:            No immediate complications. Estimated Blood Loss:     Estimated blood loss was minimal. Impression:               - Diverticulosis in the sigmoid colon and in the                            distal descending colon.                           - Melanosis in the colon.                           - The examination was otherwise normal on direct  and retroflexion views.                           - No specimens collected. Recommendation:           - Patient has a contact number available for                            emergencies. The signs and symptoms of potential                            delayed complications were discussed with the                            patient. Return to normal activities tomorrow.                            Written discharge instructions were provided to the                            patient.                           - Resume previous diet. High fiber diet recommended.                           - Continue present medications.                           - Repeat colonoscopy based on pathology results                            from 06/27/22 polypectomy. Plan two day bowel prep                            at that time. .                           - Emerging evidence supports eating a diet of                            fruits, vegetables, grains, calcium, and yogurt                            while reducing red meat and alcohol may reduce the                            risk of colon cancer.                           - Thank you for allowing me to be involved in your                            colon cancer prevention. Thornton Park MD, MD 06/29/2022 2:46:51 PM This report has been signed electronically.

## 2022-06-29 NOTE — Patient Instructions (Addendum)
- Patient has a contact number available for emergencies. The signs and symptoms of potential delayed complications were discussed with the patient. Return to normal activities tomorrow. Written discharge instructions were provided to the patient. - Resume previous diet. High fiber diet recommended. - Continue present medications. - Repeat colonoscopy based on pathology results from 06/27/22 polypectomy. Plan two day bowel prep at that time. . - Emerging evidence supports eating a diet of fruits, vegetables, grains, calcium, and yogurt while reducing red meat and alcohol may reduce the risk of colon cancer. - Thank you for allowing me to be involved in your colon cancer prevention.  Handouts on High Fiber diet and Diverticulosis given.  YOU HAD AN ENDOSCOPIC PROCEDURE TODAY AT [facility]:   Refer to the procedure report that was given to you for any specific questions about what was found during the examination.  If the procedure report does not answer your questions, please call your gastroenterologist to clarify.  If you requested that your care partner not be given the details of your procedure findings, then the procedure report has been included in a sealed envelope for you to review at your convenience later.  YOU SHOULD EXPECT: Some feelings of bloating in the abdomen. Passage of more gas than usual.  Walking can help get rid of the air that was put into your GI tract during the procedure and reduce the bloating. If you had a lower endoscopy (such as a colonoscopy or flexible sigmoidoscopy) you may notice spotting of blood in your stool or on the toilet paper. If you underwent a bowel prep for your procedure, you may not have a normal bowel movement for a few days.  Please Note:  You might notice some irritation and congestion in your nose or some drainage.  This is from the oxygen used during your procedure.  There is no need for concern and it should clear up in a day or  so.  SYMPTOMS TO REPORT IMMEDIATELY:  Following lower endoscopy (colonoscopy or flexible sigmoidoscopy):  Excessive amounts of blood in the stool  Significant tenderness or worsening of abdominal pains  Swelling of the abdomen that is new, acute  Fever of 100F or higher  For urgent or emergent issues, a gastroenterologist can be reached at any hour by calling [phone]. Do not use MyChart messaging for urgent concerns.   DIET:  We do recommend a small meal at first, but then you may proceed to your regular diet.  Drink plenty of fluids but you should avoid alcoholic beverages for 24 hours.  ACTIVITY:  You should plan to take it easy for the rest of today and you should NOT DRIVE or use heavy machinery until tomorrow (because of the sedation medicines used during the test).    FOLLOW UP: Our staff will call the number listed on your records the next business day following your procedure.  We will call around 7:15- 8:00 am to check on you and address any questions or concerns that you may have regarding the information given to you following your procedure. If we do not reach you, we will leave a message.     If any biopsies were taken you will be contacted by phone or by letter within the next 1-3 weeks.  Please call us at [phone] if you have not heard about the biopsies in 3 weeks.   SIGNATURES/CONFIDENTIALITY: You and/or your care partner have signed paperwork which will be entered into your electronic medical  record.  These signatures attest to the fact that that the information above on your After Visit Summary has been reviewed and is understood.  Full responsibility of the confidentiality of this discharge information lies with you and/or your care-partner. 

## 2022-06-29 NOTE — Progress Notes (Signed)
Pt's states no medical or surgical changes since previsit or office visit. 

## 2022-06-29 NOTE — Progress Notes (Signed)
   Referring Provider: Merrilee Seashore, MD Primary Care Physician:  Merrilee Seashore, MD   Indication for EGD:   Indication for Colonoscopy:  Colon cancer screening   IMPRESSION:   Need for colon cancer screening Appropriate candidate for monitored anesthesia care  PLAN: EGD Colonoscopy in the [facility] today   HPI: Sierra Boone is a 61 y.o. female presents for surveillance colonoscopy.   Prior endoscopic history: Colonoscopy 2012 with Dr. Olevia Perches: redundant colon, diverticulosis Colonoscopy 06/27/22: rectal polyp, left sided diverticulosis, poor prep - pathology results from the polyp are still pending  No baseline GI symptoms.    No known family history of colon cancer or polyps. No family history of uterine/endometrial cancer, pancreatic cancer or gastric/stomach cancer.   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     albuterol (VENTOLIN HFA) 108 (90 Base) MCG/ACT inhaler Inhale 2 puffs into the lungs every 6 (six) hours as needed for wheezing or shortness of breath. (Patient not taking: Reported on 05/25/2022) 8 g 6   Brimonidine Tartrate (LUMIFY) 0.025 % SOLN Place 1 drop into both eyes daily.     ondansetron (ZOFRAN) 4 MG tablet Take 1 tablet (4 mg total)  by mouth as directed for 2 doses. Take one Zofran 4 mg tablet 30-60 minutes before each prep dose 2 tablet 0   rosuvastatin (CRESTOR) 20 MG tablet Take 20 mg by mouth daily.     No current facility-administered medications for this visit.    Allergies as of 06/29/2022   (No Known Allergies)    Family History  Problem Relation Age of Onset   Glaucoma Brother    Glaucoma Maternal Grandfather    Colon cancer Neg Hx    Esophageal cancer Neg Hx    Rectal cancer Neg Hx    Stomach cancer Neg Hx      Physical Exam: General:   Alert,  well-nourished, pleasant and cooperative in NAD Head:  Normocephalic and atraumatic. Eyes:  Sclera clear, no icterus.   Conjunctiva pink. Mouth:  No deformity or lesions.   Neck:  Supple; no masses or thyromegaly. Lungs:  Clear throughout to auscultation.   No wheezes. Heart:  Regular rate and rhythm; no murmurs. Abdomen:  Soft, non-tender, nondistended, normal bowel sounds, no rebound or guarding.  Msk:  Symmetrical. No boney deformities LAD: No inguinal or umbilical LAD Extremities:  No clubbing or edema. Neurologic:  Alert and  oriented x4;  grossly nonfocal Skin:  No obvious rash or bruise. Psych:  Alert and cooperative. Normal mood and affect.     Studies/Results: No results found.    Marice Angelino L. Tarri Glenn, MD, MPH 06/29/2022, 1:00 PM

## 2022-06-29 NOTE — Progress Notes (Signed)
Sedate, gd SR, tolerated procedure well, VSS, report to RN 

## 2022-06-30 ENCOUNTER — Telehealth: Payer: Self-pay

## 2022-06-30 NOTE — Telephone Encounter (Signed)
No answer, left message to call if having any issues or concerns, B.Natane Heward RN 

## 2022-07-01 ENCOUNTER — Encounter: Payer: Self-pay | Admitting: Gastroenterology

## 2022-11-11 ENCOUNTER — Telehealth: Payer: Self-pay | Admitting: Pulmonary Disease

## 2022-11-11 NOTE — Telephone Encounter (Signed)
Working recalls pt. Stated she didn't want to receive care anymore and she was fine

## 2024-05-21 ENCOUNTER — Other Ambulatory Visit: Payer: Self-pay | Admitting: Internal Medicine

## 2024-05-21 DIAGNOSIS — H53122 Transient visual loss, left eye: Secondary | ICD-10-CM

## 2024-05-22 ENCOUNTER — Encounter: Payer: Self-pay | Admitting: Internal Medicine

## 2024-05-27 ENCOUNTER — Ambulatory Visit
Admission: RE | Admit: 2024-05-27 | Discharge: 2024-05-27 | Disposition: A | Discharge status: Home / Self Care | Source: Ambulatory Visit | Attending: Internal Medicine | Admitting: Internal Medicine

## 2024-05-27 DIAGNOSIS — H53122 Transient visual loss, left eye: Secondary | ICD-10-CM

## 2024-11-19 ENCOUNTER — Ambulatory Visit: Admitting: Diagnostic Neuroimaging
